# Patient Record
Sex: Female | Born: 1950 | Race: Black or African American | Hispanic: No | Marital: Single | Smoking: Never smoker
Health system: Southern US, Community
[De-identification: ages and names within clinical notes are randomized; demographics above are authoritative.]

## PROBLEM LIST (undated history)

## (undated) DIAGNOSIS — R011 Cardiac murmur, unspecified: Secondary | ICD-10-CM

## (undated) DIAGNOSIS — I10 Essential (primary) hypertension: Secondary | ICD-10-CM

## (undated) DIAGNOSIS — M1990 Unspecified osteoarthritis, unspecified site: Secondary | ICD-10-CM

## (undated) DIAGNOSIS — E669 Obesity, unspecified: Secondary | ICD-10-CM

## (undated) DIAGNOSIS — F329 Major depressive disorder, single episode, unspecified: Secondary | ICD-10-CM

## (undated) DIAGNOSIS — F419 Anxiety disorder, unspecified: Secondary | ICD-10-CM

## (undated) DIAGNOSIS — F32A Depression, unspecified: Secondary | ICD-10-CM

## (undated) DIAGNOSIS — R9439 Abnormal result of other cardiovascular function study: Secondary | ICD-10-CM

## (undated) HISTORY — DX: Cardiac murmur, unspecified: R01.1

## (undated) HISTORY — DX: Unspecified osteoarthritis, unspecified site: M19.90

## (undated) HISTORY — DX: Anxiety disorder, unspecified: F41.9

## (undated) HISTORY — DX: Major depressive disorder, single episode, unspecified: F32.9

## (undated) HISTORY — PX: ABDOMINAL HYSTERECTOMY: SHX81

## (undated) HISTORY — DX: Depression, unspecified: F32.A

## (undated) HISTORY — DX: Obesity, unspecified: E66.9

---

## 2001-02-17 ENCOUNTER — Emergency Department (HOSPITAL_COMMUNITY): Admission: EM | Admit: 2001-02-17 | Discharge: 2001-02-17 | Payer: Self-pay | Admitting: Emergency Medicine

## 2002-05-20 ENCOUNTER — Encounter: Payer: Self-pay | Admitting: Internal Medicine

## 2002-05-20 ENCOUNTER — Encounter: Admission: RE | Admit: 2002-05-20 | Discharge: 2002-05-20 | Payer: Self-pay | Admitting: Internal Medicine

## 2003-01-06 ENCOUNTER — Encounter: Admission: RE | Admit: 2003-01-06 | Discharge: 2003-01-06 | Payer: Self-pay | Admitting: Internal Medicine

## 2003-04-27 ENCOUNTER — Emergency Department (HOSPITAL_COMMUNITY): Admission: EM | Admit: 2003-04-27 | Discharge: 2003-04-27 | Payer: Self-pay | Admitting: Emergency Medicine

## 2005-10-31 ENCOUNTER — Encounter: Admission: RE | Admit: 2005-10-31 | Discharge: 2005-10-31 | Payer: Self-pay | Admitting: Internal Medicine

## 2005-12-02 ENCOUNTER — Other Ambulatory Visit: Admission: RE | Admit: 2005-12-02 | Discharge: 2005-12-02 | Payer: Self-pay | Admitting: Interventional Radiology

## 2005-12-02 ENCOUNTER — Encounter: Admission: RE | Admit: 2005-12-02 | Discharge: 2005-12-02 | Payer: Self-pay | Admitting: Internal Medicine

## 2005-12-02 ENCOUNTER — Encounter (INDEPENDENT_AMBULATORY_CARE_PROVIDER_SITE_OTHER): Payer: Self-pay | Admitting: *Deleted

## 2006-02-18 DIAGNOSIS — M129 Arthropathy, unspecified: Secondary | ICD-10-CM | POA: Insufficient documentation

## 2006-03-12 ENCOUNTER — Encounter: Admission: RE | Admit: 2006-03-12 | Discharge: 2006-03-12 | Payer: Self-pay | Admitting: Surgery

## 2006-03-13 ENCOUNTER — Encounter (INDEPENDENT_AMBULATORY_CARE_PROVIDER_SITE_OTHER): Payer: Self-pay | Admitting: Specialist

## 2006-03-13 ENCOUNTER — Ambulatory Visit (HOSPITAL_BASED_OUTPATIENT_CLINIC_OR_DEPARTMENT_OTHER): Admission: RE | Admit: 2006-03-13 | Discharge: 2006-03-13 | Payer: Self-pay | Admitting: Surgery

## 2006-04-17 ENCOUNTER — Encounter: Admission: RE | Admit: 2006-04-17 | Discharge: 2006-04-17 | Payer: Self-pay | Admitting: Internal Medicine

## 2007-09-23 ENCOUNTER — Ambulatory Visit: Payer: Self-pay | Admitting: Nurse Practitioner

## 2007-09-23 DIAGNOSIS — E669 Obesity, unspecified: Secondary | ICD-10-CM | POA: Insufficient documentation

## 2007-09-23 DIAGNOSIS — K219 Gastro-esophageal reflux disease without esophagitis: Secondary | ICD-10-CM | POA: Insufficient documentation

## 2007-09-28 ENCOUNTER — Encounter (INDEPENDENT_AMBULATORY_CARE_PROVIDER_SITE_OTHER): Payer: Self-pay | Admitting: Nurse Practitioner

## 2007-10-02 ENCOUNTER — Encounter (INDEPENDENT_AMBULATORY_CARE_PROVIDER_SITE_OTHER): Payer: Self-pay | Admitting: Nurse Practitioner

## 2007-11-18 ENCOUNTER — Emergency Department (HOSPITAL_COMMUNITY): Admission: EM | Admit: 2007-11-18 | Discharge: 2007-11-18 | Payer: Self-pay | Admitting: Family Medicine

## 2007-12-15 ENCOUNTER — Encounter (INDEPENDENT_AMBULATORY_CARE_PROVIDER_SITE_OTHER): Payer: Self-pay | Admitting: *Deleted

## 2008-04-19 ENCOUNTER — Encounter (INDEPENDENT_AMBULATORY_CARE_PROVIDER_SITE_OTHER): Payer: Self-pay | Admitting: Nurse Practitioner

## 2008-04-19 ENCOUNTER — Ambulatory Visit: Payer: Self-pay | Admitting: Nurse Practitioner

## 2008-04-19 DIAGNOSIS — I10 Essential (primary) hypertension: Secondary | ICD-10-CM | POA: Insufficient documentation

## 2008-04-19 DIAGNOSIS — I8390 Asymptomatic varicose veins of unspecified lower extremity: Secondary | ICD-10-CM | POA: Insufficient documentation

## 2008-04-19 LAB — CONVERTED CEMR LAB
ALT: 21 units/L (ref 0–35)
AST: 22 units/L (ref 0–37)
Albumin: 4.3 g/dL (ref 3.5–5.2)
Alkaline Phosphatase: 51 units/L (ref 39–117)
BUN: 11 mg/dL (ref 6–23)
Band Neutrophils: 0 % (ref 0–10)
Basophils Absolute: 0 10*3/uL (ref 0.0–0.1)
Basophils Relative: 0 % (ref 0–1)
CO2: 25 meq/L (ref 19–32)
Calcium: 9.2 mg/dL (ref 8.4–10.5)
Chlamydia, DNA Probe: NEGATIVE
Chloride: 107 meq/L (ref 96–112)
Cholesterol: 215 mg/dL — ABNORMAL HIGH (ref 0–200)
Creatinine, Ser: 0.85 mg/dL (ref 0.40–1.20)
Eosinophils Absolute: 0.1 10*3/uL (ref 0.0–0.7)
Eosinophils Relative: 1 % (ref 0–5)
GC Probe Amp, Genital: NEGATIVE
Glucose, Bld: 98 mg/dL (ref 70–99)
Glucose, Urine, Semiquant: NEGATIVE
HCT: 40.8 % (ref 36.0–46.0)
HDL: 63 mg/dL (ref >39)
Hemoglobin: 13 g/dL (ref 12.0–15.0)
KOH Prep: NEGATIVE
Ketones, urine, test strip: NEGATIVE
LDL Cholesterol: 138 mg/dL — ABNORMAL HIGH (ref 0–99)
Lymphocytes Relative: 47 % — ABNORMAL HIGH (ref 12–46)
Lymphs Abs: 2.5 10*3/uL (ref 0.7–4.0)
MCHC: 31.9 g/dL (ref 30.0–36.0)
MCV: 90.7 fL (ref 78.0–100.0)
Monocytes Absolute: 0.5 10*3/uL (ref 0.1–1.0)
Monocytes Relative: 10 % (ref 3–12)
Neutro Abs: 2.2 10*3/uL (ref 1.7–7.7)
Neutrophils Relative %: 42 % — ABNORMAL LOW (ref 43–77)
Nitrite: NEGATIVE
OCCULT 1: NEGATIVE
Platelets: 266 10*3/uL (ref 150–400)
Potassium: 4.3 meq/L (ref 3.5–5.3)
RBC: 4.5 M/uL (ref 3.87–5.11)
RDW: 15 % (ref 11.5–15.5)
Sodium: 145 meq/L (ref 135–145)
Specific Gravity, Urine: >=1.03
TSH: 1.64 microintl units/mL (ref 0.350–4.50)
Total Bilirubin: 0.4 mg/dL (ref 0.3–1.2)
Total CHOL/HDL Ratio: 3.4
Total Protein: 7.4 g/dL (ref 6.0–8.3)
Triglycerides: 68 mg/dL (ref <150)
Urobilinogen, UA: 0.2
VLDL: 14 mg/dL (ref 0–40)
WBC Urine, dipstick: NEGATIVE
WBC: 5.3 10*3/uL (ref 4.0–10.5)
pH: 6

## 2008-04-20 ENCOUNTER — Encounter (INDEPENDENT_AMBULATORY_CARE_PROVIDER_SITE_OTHER): Payer: Self-pay | Admitting: Nurse Practitioner

## 2008-04-21 ENCOUNTER — Ambulatory Visit (HOSPITAL_COMMUNITY): Admission: RE | Admit: 2008-04-21 | Discharge: 2008-04-21 | Payer: Self-pay | Admitting: Family Medicine

## 2008-04-22 ENCOUNTER — Encounter (INDEPENDENT_AMBULATORY_CARE_PROVIDER_SITE_OTHER): Payer: Self-pay | Admitting: Family Medicine

## 2008-04-27 ENCOUNTER — Encounter: Admission: RE | Admit: 2008-04-27 | Discharge: 2008-04-27 | Payer: Self-pay | Admitting: Family Medicine

## 2008-09-29 ENCOUNTER — Ambulatory Visit: Payer: Self-pay | Admitting: Nurse Practitioner

## 2008-09-29 DIAGNOSIS — R0602 Shortness of breath: Secondary | ICD-10-CM | POA: Insufficient documentation

## 2008-09-29 DIAGNOSIS — R609 Edema, unspecified: Secondary | ICD-10-CM | POA: Insufficient documentation

## 2008-09-29 LAB — CONVERTED CEMR LAB
Bilirubin Urine: NEGATIVE
Glucose, Urine, Semiquant: NEGATIVE
Ketones, urine, test strip: NEGATIVE
Microalb, Ur: 0.87 mg/dL (ref 0.00–1.89)
Nitrite: NEGATIVE
Protein, U semiquant: NEGATIVE
Specific Gravity, Urine: 1.025
Urobilinogen, UA: 0.2
pH: 5.5

## 2008-09-30 ENCOUNTER — Encounter (INDEPENDENT_AMBULATORY_CARE_PROVIDER_SITE_OTHER): Payer: Self-pay | Admitting: Nurse Practitioner

## 2008-09-30 LAB — CONVERTED CEMR LAB: Pro B Natriuretic peptide (BNP): 3.6 pg/mL (ref 0.0–100.0)

## 2008-10-03 ENCOUNTER — Telehealth (INDEPENDENT_AMBULATORY_CARE_PROVIDER_SITE_OTHER): Payer: Self-pay | Admitting: Nurse Practitioner

## 2008-10-19 ENCOUNTER — Ambulatory Visit: Payer: Self-pay | Admitting: Nurse Practitioner

## 2008-10-19 DIAGNOSIS — M545 Low back pain, unspecified: Secondary | ICD-10-CM | POA: Insufficient documentation

## 2008-10-19 LAB — CONVERTED CEMR LAB
Bilirubin Urine: NEGATIVE
Blood in Urine, dipstick: NEGATIVE
Glucose, Urine, Semiquant: NEGATIVE
Ketones, urine, test strip: NEGATIVE
Nitrite: NEGATIVE
Protein, U semiquant: 30
Specific Gravity, Urine: 1.015
Urobilinogen, UA: 0.2
pH: 7.5

## 2008-11-10 ENCOUNTER — Encounter (INDEPENDENT_AMBULATORY_CARE_PROVIDER_SITE_OTHER): Payer: Self-pay | Admitting: Nurse Practitioner

## 2008-12-06 ENCOUNTER — Emergency Department (HOSPITAL_COMMUNITY): Admission: EM | Admit: 2008-12-06 | Discharge: 2008-12-06 | Payer: Self-pay | Admitting: Family Medicine

## 2008-12-07 ENCOUNTER — Ambulatory Visit: Payer: Self-pay | Admitting: Nurse Practitioner

## 2008-12-09 ENCOUNTER — Ambulatory Visit (HOSPITAL_COMMUNITY): Admission: RE | Admit: 2008-12-09 | Discharge: 2008-12-09 | Payer: Self-pay | Admitting: Internal Medicine

## 2009-10-08 ENCOUNTER — Emergency Department (HOSPITAL_COMMUNITY): Admission: EM | Admit: 2009-10-08 | Discharge: 2009-10-08 | Payer: Self-pay | Admitting: Emergency Medicine

## 2009-10-19 ENCOUNTER — Ambulatory Visit: Payer: Self-pay | Admitting: Nurse Practitioner

## 2009-10-19 LAB — CONVERTED CEMR LAB
Bilirubin Urine: NEGATIVE
Creatinine, Urine: 213.3 mg/dL
Glucose, Urine, Semiquant: NEGATIVE
Ketones, urine, test strip: NEGATIVE
Microalb Creat Ratio: 2.3 mg/g (ref 0.0–30.0)
Microalb, Ur: 0.5 mg/dL (ref 0.00–1.89)
Nitrite: NEGATIVE
Protein, U semiquant: NEGATIVE
Specific Gravity, Urine: >=1.03
Urobilinogen, UA: 0.2
pH: 6

## 2010-03-09 ENCOUNTER — Encounter (INDEPENDENT_AMBULATORY_CARE_PROVIDER_SITE_OTHER): Payer: Self-pay | Admitting: Nurse Practitioner

## 2010-03-09 ENCOUNTER — Ambulatory Visit
Admission: RE | Admit: 2010-03-09 | Discharge: 2010-03-09 | Payer: Self-pay | Source: Home / Self Care | Attending: Nurse Practitioner | Admitting: Nurse Practitioner

## 2010-03-10 ENCOUNTER — Encounter: Payer: Self-pay | Admitting: Internal Medicine

## 2010-03-20 NOTE — Assessment & Plan Note (Signed)
Summary: HTN   Vital Signs:  Patient profile:   60 year old female Height:      65.25 inches Weight:      258 pounds BMI:     42.76 Temp:     98.3 degrees F oral Pulse rate:   69 / minute Pulse rhythm:   regular Resp:     16 per minute BP sitting:   158 / 88  (left arm)  Vitals Entered By: CMA Student Linzie Collin  Nutrition Counseling: Patient's BMI is greater than 25 and therefore counseled on weight management options. CC: follow-up visit BP, patient states blood pressure has ben up and dwon, patient has been experiencing headaches, Hypertension Management Is Patient Diabetic? No Pain Assessment Patient in pain? no       Does patient need assistance? Functional Status Self care Ambulation Normal   CC:  follow-up visit BP, patient states blood pressure has ben up and dwon, patient has been experiencing headaches, and Hypertension Management.  History of Present Illness:  Pt into the office for follow up on Er visit.   10/08/2009 for hypertension (full ER visit reviewed) Pt admits that she completed her supply of BP medications prior to ER visit. She notes that prior to onset she ate a pot pie which she found out afterward was high in sodium.  Social - Pt is not employed however he is school at this time.  Hypertension History:      She complains of headache, but denies chest pain and palpitations.  She notes no problems with any antihypertensive medication side effects.  Pt has completed her supply of blood pressure medication.  She was given lisinopril 5mg  by mouth x 10 tablets from the ER which she has finished as well.        Positive major cardiovascular risk factors include female age 60 years old or older and hypertension.  Negative major cardiovascular risk factors include no history of diabetes or hyperlipidemia, negative family history for ischemic heart disease, and non-tobacco-user status.        Further assessment for target organ damage reveals no  history of ASHD, cardiac end-organ damage (CHF/LVH), stroke/TIA, peripheral vascular disease, renal insufficiency, or hypertensive retinopathy.     Habits & Providers  Alcohol-Tobacco-Diet     Alcohol drinks/day: 0     Tobacco Status: never  Exercise-Depression-Behavior     Does Patient Exercise: no     Type of exercise: walking,cardio     Depression Counseling: not indicated; screening negative for depression     Drug Use: no     Seat Belt Use: 0     Sun Exposure: occasionally  Comments: Pt has recently gotten a gym membership at [employer] because she had slacked off her activities.  Current Medications (verified): 1)  Nexium 40 Mg  Cpdr (Esomeprazole Magnesium) .Marland Kitchen.. 1 Tablet By Mouth Daily For Stomach 2)  Zestoretic 10-12.5 Mg Tabs (Lisinopril-Hydrochlorothiazide) .... One Tablet By Mouth Daily For Blood Pressure 3)  Naproxen 500 Mg Tabs (Naproxen) .... One Tablet By Mouth Every 12 Hours As Needed For Pain 4)  Ultram 50 Mg Tabs (Tramadol Hcl) .Marland Kitchen.. 1-2 Tabs Every 6 Hours As Needed For Pain 5)  Cyclobenzaprine Hcl 10 Mg Tabs (Cyclobenzaprine Hcl) .... 1/2 - 1 Tablet By Mouth Nightly For Muscle Spasms  Allergies (verified): No Known Drug Allergies  Social History: Does Patient Exercise:  no  Review of Systems General:  Complains of weakness; denies fever. CV:  Denies chest pain or discomfort.  Resp:  Denies cough. GI:  Denies abdominal pain, nausea, and vomiting. Neuro:  Complains of headaches. Psych:  Denies depression.  Physical Exam  General:  alert.   Head:  normocephalic.   Lungs:  normal breath sounds.   Heart:  normal rate and regular rhythm.   Abdomen:  normal bowel sounds.   Extremities:  1+ left pedal edema and 1+ right pedal edema.   Neurologic:  gait normal.     Impression & Recommendations:  Problem # 1:  HYPERTENSION, BENIGN ESSENTIAL (ICD-401.1) DASH diet reviewed with pt will restart blood pressure medications Her updated medication list for  this problem includes:    Zestoretic 10-12.5 Mg Tabs (Lisinopril-hydrochlorothiazide) ..... One tablet by mouth daily for blood pressure  Orders: UA Dipstick w/o Micro (manual) (91478) T-Urine Microalbumin w/creat. ratio [phone])  Problem # 2:  OBESITY (ICD-278.00) pt to restart her exercise regimen pt advised to continue to monitor diet and make better food choices will refer for a nutrition class  Complete Medication List: 1)  Nexium 40 Mg Cpdr (Esomeprazole magnesium) .Marland Kitchen.. 1 tablet by mouth daily for stomach 2)  Zestoretic 10-12.5 Mg Tabs (Lisinopril-hydrochlorothiazide) .... One tablet by mouth daily for blood pressure 3)  Naproxen 500 Mg Tabs (Naproxen) .... One tablet by mouth every 12 hours as needed for pain 4)  Ultram 50 Mg Tabs (Tramadol hcl) .Marland Kitchen.. 1-2 tabs every 6 hours as needed for pain 5)  Cyclobenzaprine Hcl 10 Mg Tabs (Cyclobenzaprine hcl) .... 1/2 - 1 tablet by mouth nightly for muscle spasms  Hypertension Assessment/Plan:      The patient's hypertensive risk group is category B: At least one risk factor (excluding diabetes) with no target organ damage.  Her calculated 10 year risk of coronary heart disease is 8 %.  Today's blood pressure is 158/88.  Her blood pressure goal is < 140/90.  Patient Instructions: 1)  Keep your appointment for a complete physical exam 2)  do not eat after midnight before this visit 3)  You will be contacted about the group nutrition meeting that will be held here every 2nd and 4th Wednesday starting in October. 4)  Blood pressure - high today.  You will need to restart your blood pressure medications.  Be sure to monitor the sodium in your diet. Prescriptions: ZESTORETIC 10-12.5 MG TABS (LISINOPRIL-HYDROCHLOROTHIAZIDE) One tablet by mouth daily for blood pressure  #30 x 5   Entered and Authorized by:   Lehman Prom FNP   Signed by:   Lehman Prom FNP on 60/02/2009   Method used:   Print then Give to Patient   RxID:    [id number]   Laboratory Results   Urine Tests    Routine Urinalysis   Color: yellow Glucose: negative   (Normal Range: Negative) Bilirubin: negative   (Normal Range: Negative) Ketone: negative   (Normal Range: Negative) Spec. Gravity: >=1.030   (Normal Range: 1.003-1.035) Blood: trace-intact   (Normal Range: Negative) pH: 6.0   (Normal Range: 5.0-8.0) Protein: negative   (Normal Range: Negative) Urobilinogen: 0.2   (Normal Range: 0-1) Nitrite: negative   (Normal Range: Negative) Leukocyte Esterace: large   (Normal Range: Negative)        CT Scan  Procedure date:  10/08/2009  Findings:      Normal head CT   CT Scan  Procedure date:  10/08/2009  Findings:      Normal head CT

## 2010-03-22 NOTE — Letter (Signed)
Summary: *Referral Letter  [facility]  [address] [place], [place] [address]   Phone: [phone]  Fax: [phone]    03/09/2010  [name] [address] [place], [place]  [address]  Phone: [phone]  To Whom it May Concern: Valerie Cowan is an established patient in this office.  She has a history of arthritis in her knees. Recent exercises that require bending at the knees such as squats and  lunges have exacerbated her knee symptoms. It is my recommendation that she not participate in these exercises.   Water aerobics would be more beneficial to the patient as it would reduce the inpact on her joints.   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  Sincerely,   Lehman Prom FNP [facility]

## 2010-03-22 NOTE — Assessment & Plan Note (Signed)
Summary: Knee pain   Vital Signs:  Patient profile:   60 year old female Weight:      252.2 pounds BMI:     41.80 Temp:     97.8 degrees F oral Pulse rate:   76 / minute Pulse rhythm:   regular Resp:     20 per minute BP sitting:   140 / 100  (left arm) Cuff size:   large  Vitals Entered By: Levon Hedger (March 09, 2010 2:55 PM)  Nutrition Counseling: Patient's BMI is greater than 25 and therefore counseled on weight management options. CC: knee pain x 2 weeks, Hypertension Management Is Patient Diabetic? No Pain Assessment Patient in pain? yes     Location: knee Intensity: 10  Does patient need assistance? Functional Status Self care Ambulation Normal   CC:  knee pain x 2 weeks and Hypertension Management.  History of Present Illness:  Pt into the office with c/o knee pain Pt has started a new exercise class in the last 2 weeks Some achiness in knees prior to that start of the class but pain has intensified Exercise requires some lunges, aerobics L>R Stiffness when sitting for long periods of time Unable to bend down and put sock and shoe on due to the pain   Obesit - down 6 pounds since the last visit  Hypertension History:      She denies headache, chest pain, and palpitations.  She notes no problems with any antihypertensive medication side effects.  BP has refills on her blood pressure medication and she has been taking as ordered  Tearful during exam because she speaks about the recent passing of her sister and within the past hour she learned of her aunt that raised her passing.        Positive major cardiovascular risk factors include female age 99 years old or older and hypertension.  Negative major cardiovascular risk factors include no history of diabetes or hyperlipidemia, negative family history for ischemic heart disease, and non-tobacco-user status.        Further assessment for target organ damage reveals no history of ASHD, cardiac end-organ  damage (CHF/LVH), stroke/TIA, peripheral vascular disease, renal insufficiency, or hypertensive retinopathy.     Allergies (verified): No Known Drug Allergies  Review of Systems General:  Denies fever. CV:  Denies chest pain or discomfort. Resp:  Denies cough. GI:  Denies abdominal pain, nausea, and vomiting. MS:  Complains of joint pain.  Physical Exam  General:  alert.   Head:  normocephalic.   Eyes:  glasses Lungs:  normal breath sounds.   Heart:  normal rate and regular rhythm.   Abdomen:  normal bowel sounds.   Msk:  decreased ROM.   Neurologic:  alert & oriented X3.   Skin:  color normal.   Psych:  Oriented X3.     Knee Exam  General:    obese.    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)  advised of the dx pt has arthritis and recent aerobics class has worsened symptoms advised her not to participate in knee bending activities  Orders: Depo- Medrol 80mg  (J1040) Ketorolac-Toradol 15mg  (J1914) Admin of Therapeutic Inj  intramuscular or subcutaneous (  04540)  Problem # 2:  HYPERTENSION, BENIGN ESSENTIAL (ICD-401.1) Assessment: Deteriorated BP is slightly elevated  DASH diet Pt to continue current meds - pain today in office may contribute to elevated blood pressure Her updated medication list for this problem includes:    Zestoretic 10-12.5 Mg Tabs (Lisinopril-hydrochlorothiazide) ..... One tablet by mouth daily for blood pressure  Complete Medication List: 1)  Zestoretic 10-12.5 Mg Tabs (Lisinopril-hydrochlorothiazide) .... One tablet by mouth daily for blood pressure 2)  Naproxen 500 Mg Tabs (Naproxen) .... One tablet by mouth every 12 hours as needed for pain  Hypertension Assessment/Plan:      The patient's hypertensive risk group is  category B: At least one risk factor (excluding diabetes) with no target organ damage.  Her calculated 10 year risk of coronary heart disease is 9 %.  Today's blood pressure is 140/100.  Her blood pressure goal is < 140/90.  Patient Instructions: 1)  Blood pressure - elevated today.  May be due to pain or recent loss of family 2)  knee pain - You have arthritis in your knees.  This pain has likely worsened since you started the aerobics class.  3)  You will be given an injection of medication today. 4)  STOP taking aleve and BC as they will irritate your stomach. 5)  Will order Naprosyn as needed for pain. 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)    Route: IM    Site: RUOQ gluteus    Exp Date: 08/2010    Lot #: [id number]    Mfr: Pharmacia    Patient tolerated injection without complications    Given by: Levon Hedger (March 09, 2010 5:36 PM)  Injection # 2:    Medication: Ketorolac-Toradol 15mg     Diagnosis: ARTHRITIS, KNEES, 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 Patient Level III [52841]    Prevention & Chronic Care Immunizations   Influenza vaccine: Not documented   Influenza  vaccine deferral: Refused  (03/09/2010)    Tetanus booster: 04/19/2008: Tdap    Pneumococcal vaccine: Not documented  Colorectal Screening   Hemoccult: Not documented    Colonoscopy: negative per pt  (09/19/2006)  Other Screening   Pap smear:  Specimen Adequacy: Satisfactory for evaluation.   Interpretation/Result:Negative for intraepithelial Lesion or Malignancy.     (04/19/2008)   Pap smear action/deferral: PAP smear done  (04/19/2008)    Mammogram: BI-RADS CATEGORY 1:  Negative.^MM DIGITAL DIAG LTD R  (04/27/2008)   Mammogram action/deferral: mammogram ordered  (04/19/2008)   Smoking status: never  (10/19/2009)  Lipids  Total Cholesterol: 215  (04/19/2008)   LDL: 138  (04/19/2008)   LDL Direct: Not documented   HDL: 63  (04/19/2008)   Triglycerides: 68  (04/19/2008)  Hypertension   Last Blood Pressure: 140 / 100  (03/09/2010)   Serum creatinine: 0.85  (04/19/2008)   Serum potassium 4.3  (04/19/2008)  Self-Management Support :    Hypertension self-management support: Not documented   Medication Administration  Injection # 1:    Medication: Depo- Medrol 80mg     Diagnosis: ARTHRITIS, KNEES, 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)    Route: IM    Site: RUOQ gluteus    Exp Date: 08/2010    Lot #: [id number]    Mfr: Pharmacia    Patient tolerated injection without complications    Given by: Levon Hedger (March 09, 2010 5:36 PM)  Injection # 2:    Medication: Ketorolac-Toradol 15mg     Diagnosis: ARTHRITIS, KNEES, 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 Patient Level III [81191]

## 2010-04-30 ENCOUNTER — Encounter: Payer: Self-pay | Admitting: Nurse Practitioner

## 2010-04-30 ENCOUNTER — Other Ambulatory Visit (HOSPITAL_COMMUNITY): Payer: Self-pay | Admitting: Internal Medicine

## 2010-04-30 ENCOUNTER — Encounter (INDEPENDENT_AMBULATORY_CARE_PROVIDER_SITE_OTHER): Payer: Self-pay | Admitting: Nurse Practitioner

## 2010-04-30 ENCOUNTER — Other Ambulatory Visit: Payer: Self-pay | Admitting: Nurse Practitioner

## 2010-04-30 DIAGNOSIS — Z1231 Encounter for screening mammogram for malignant neoplasm of breast: Secondary | ICD-10-CM

## 2010-04-30 LAB — CONVERTED CEMR LAB
Bilirubin Urine: NEGATIVE
Blood in Urine, dipstick: NEGATIVE
Glucose, Urine, Semiquant: NEGATIVE
KOH Prep: NEGATIVE
Ketones, urine, test strip: NEGATIVE
Nitrite: NEGATIVE
Protein, U semiquant: NEGATIVE
Specific Gravity, Urine: 1.015
Urobilinogen, UA: 0.2
pH: 7

## 2010-04-30 LAB — CYTOLOGY - PAP

## 2010-05-01 ENCOUNTER — Encounter (INDEPENDENT_AMBULATORY_CARE_PROVIDER_SITE_OTHER): Payer: Self-pay | Admitting: Nurse Practitioner

## 2010-05-01 LAB — CONVERTED CEMR LAB
ALT: 13 units/L (ref 0–35)
AST: 19 units/L (ref 0–37)
Albumin: 4 g/dL (ref 3.5–5.2)
Alkaline Phosphatase: 49 units/L (ref 39–117)
BUN: 14 mg/dL (ref 6–23)
Basophils Absolute: 0 10*3/uL (ref 0.0–0.1)
Basophils Relative: 0 % (ref 0–1)
CO2: 30 meq/L (ref 19–32)
Calcium: 9.4 mg/dL (ref 8.4–10.5)
Chlamydia, DNA Probe: NEGATIVE
Chloride: 104 meq/L (ref 96–112)
Creatinine, Ser: 1.01 mg/dL (ref 0.40–1.20)
Eosinophils Absolute: 0.1 10*3/uL (ref 0.0–0.7)
Eosinophils Relative: 1 % (ref 0–5)
GC Probe Amp, Genital: NEGATIVE
Glucose, Bld: 90 mg/dL (ref 70–99)
HCT: 38.3 % (ref 36.0–46.0)
Hemoglobin: 12.2 g/dL (ref 12.0–15.0)
Lymphocytes Relative: 48 % — ABNORMAL HIGH (ref 12–46)
Lymphs Abs: 3.7 10*3/uL (ref 0.7–4.0)
MCHC: 31.9 g/dL (ref 30.0–36.0)
MCV: 89.3 fL (ref 78.0–100.0)
Microalb, Ur: 0.5 mg/dL (ref 0.00–1.89)
Monocytes Absolute: 0.6 10*3/uL (ref 0.1–1.0)
Monocytes Relative: 8 % (ref 3–12)
Neutro Abs: 3.2 10*3/uL (ref 1.7–7.7)
Neutrophils Relative %: 42 % — ABNORMAL LOW (ref 43–77)
Platelets: 271 10*3/uL (ref 150–400)
Potassium: 4.7 meq/L (ref 3.5–5.3)
RBC: 4.29 M/uL (ref 3.87–5.11)
RDW: 15.1 % (ref 11.5–15.5)
Sodium: 141 meq/L (ref 135–145)
TSH: 1.252 microintl units/mL (ref 0.350–4.500)
Total Bilirubin: 0.3 mg/dL (ref 0.3–1.2)
Total Protein: 7.1 g/dL (ref 6.0–8.3)
WBC: 7.6 10*3/uL (ref 4.0–10.5)

## 2010-05-02 ENCOUNTER — Encounter (INDEPENDENT_AMBULATORY_CARE_PROVIDER_SITE_OTHER): Payer: Self-pay | Admitting: Nurse Practitioner

## 2010-05-07 ENCOUNTER — Other Ambulatory Visit: Payer: Self-pay | Admitting: Internal Medicine

## 2010-05-07 ENCOUNTER — Ambulatory Visit (HOSPITAL_COMMUNITY)
Admission: RE | Admit: 2010-05-07 | Discharge: 2010-05-07 | Disposition: A | Discharge status: Home / Self Care | Payer: Self-pay | Source: Ambulatory Visit | Attending: Internal Medicine | Admitting: Internal Medicine

## 2010-05-07 DIAGNOSIS — M25569 Pain in unspecified knee: Secondary | ICD-10-CM

## 2010-05-07 DIAGNOSIS — Z1231 Encounter for screening mammogram for malignant neoplasm of breast: Secondary | ICD-10-CM | POA: Insufficient documentation

## 2010-05-08 NOTE — Letter (Signed)
Summary: TEST ORDER FORM//MAMMOGRAM//APPT DATE & TIME  TEST ORDER FORM//MAMMOGRAM//APPT DATE & TIME   Imported By: Arta Bruce 05/04/2010 11:50:17  _____________________________________________________________________  External Attachment:    Type:   Image     Comment:   External Document

## 2010-05-08 NOTE — Letter (Signed)
Summary: *HSN Results Follow up  [facility]  [address] [place], [place] [address]   Phone: [phone]  Fax: [phone]      05/01/2010   Valerie Cowan [address] [place], [place]  [address]   Dear  Valerie Cowan,                            ____S.Drinkard,FNP   ____D. Gore,FNP       ____B. McPherson,MD   ____V. Rankins,MD    ____E. Mulberry,MD    __X__N. Daphine Deutscher, FNP  ____D. Reche Dixon, MD    ____K. Philipp Deputy, MD    ____Other     This letter is to inform you that your recent test(s):  ___X____Pap Smear    ___X____Lab Test     _______X-ray    ___X____ is within acceptable limits  _______ requires a medication change  _______ requires a follow-up lab visit  _______ requires a follow-up visit with your provider   Comments: Labs done during recent office visist are normal.  Pap Smear results ________________________.       _________________________________________________________ If you have any questions, please contact our office [phone].                    Sincerely,    Lehman Prom FNP [facility]

## 2010-05-08 NOTE — Assessment & Plan Note (Signed)
Summary: Complete Physical Exam   Vital Signs:  Patient profile:   60 year old female Menstrual status:  postmenopausal Weight:      259.8 pounds BMI:     43.06 Temp:     98.1 degrees F oral Pulse rate:   71 / minute Pulse rhythm:   regular Resp:     16 per minute BP sitting:   133 / 80  (left arm) Cuff size:   large  Vitals Entered By: Levon Hedger (April 30, 2010 3:14 PM)  Nutrition Counseling: Patient's BMI is greater than 25 and therefore counseled on weight management options. CC: CPP...left leg pain, Hypertension Management, Abdominal Pain Is Patient Diabetic? No Pain Assessment Patient in pain? yes     Location: leg Intensity: 8-9  Does patient need assistance? Functional Status Self care Ambulation Normal  Vision Screening:Left eye with correction: 20 / 20-1 Right eye with correction: 20 / 20-2 Both eyes with correction: 20 / 20-1        Vision Entered By: Levon Hedger (April 30, 2010 3:42 PM) LMP - Character: partial hysterctomy     Menstrual Status postmenopausal Last PAP Result  Specimen Adequacy: Satisfactory for evaluation.   Interpretation/Result:Negative for intraepithelial Lesion or Malignancy.      CC:  CPP...left leg pain, Hypertension Management, and Abdominal Pain.  History of Present Illness:  Pt into the office for a complete physical exam  PAP - last done in 2010. S/p partial hysterectomy no family history of cervical or ovarian cancer  Mammgram - last mammogram on 2010 no family history of breast cancer  Optho - wears glasses.  last eye exam 2 years ago  Dental - last dental exam  Left knee - ongoing problems. Notices some swelling at times.  Sitting for long periods of time bothers the pt  Dyspepsia History:      She has no alarm features of dyspepsia including no history of melena, hematochezia, dysphagia, persistent vomiting, or involuntary weight loss > 5%.  There is a prior history of GERD.  The patient does not  have a prior history of documented ulcer disease.  The dominant symptom is not heartburn or acid reflux.  An H-2 blocker medication is not currently being taken.    Hypertension History:      She denies headache, chest pain, and palpitations.  She notes no problems with any antihypertensive medication side effects.        Positive major cardiovascular risk factors include female age 81 years old or older and hypertension.  Negative major cardiovascular risk factors include no history of diabetes or hyperlipidemia, negative family history for ischemic heart disease, and non-tobacco-user status.        Further assessment for target organ damage reveals no history of ASHD, cardiac end-organ damage (CHF/LVH), stroke/TIA, peripheral vascular disease, renal insufficiency, or hypertensive retinopathy.      Allergies (verified): No Known Drug Allergies  Review of Systems General:  Denies fever. Eyes:  Denies discharge. ENT:  Denies earache. CV:  Denies fatigue. Resp:  Denies cough. GI:  Denies abdominal pain, nausea, and vomiting. GU:  Denies discharge. MS:  Complains of joint pain; bil knee pain L>R. Derm:  Denies dryness. Neuro:  Denies headaches. Psych:  Denies depression. Endo:  Denies excessive urination.  Physical Exam  General:  alert.   Head:  normocephalic.   Eyes:  glasses Ears:  bil TM with bony landmarks present Nose:  no nasal discharge.   Mouth:  fair dentition.  Neck:  supple.   Chest Wall:  no mass.   Breasts:  no masses.   Lungs:  normal breath sounds.   Heart:  normal rate and regular rhythm.   Abdomen:  normal bowel sounds.   Rectal:  no external abnormalities.   Neurologic:  alert & oriented X3.   Skin:  color normal.   Psych:  Oriented X3.    Pelvic Exam  Vulva:      normal appearance.   Urethra and Bladder:      Urethra--normal.   Vagina:      physiologic discharge.   Cervix:      absent Uterus:      smooth.   Adnexa:      nontender  bilaterally.   Rectum:      normal, heme negative stool.     Knee Exam  General:    obese.    Knee Exam:    Left:    Inspection:  Abnormal    Palpation:  Abnormal       Location:  patellar    Stability:  stable    Tenderness:  no    Swelling:  no    Erythema:  no    crepitus with flexion    Impression & Recommendations:  Problem # 1:  ROUTINE GYNECOLOGICAL EXAMINATION (ICD-V72.31) rec optho and dental guaiac negative Orders: Vision Screening (16109) Hemoccult Guaiac-1 spec.(in office) (82270) Pap Smear, Thin Prep ( Collection of) (U0454) UA Dipstick w/o Micro (manual) (81002) Rapid HIV  (09811) Radiology other (Radiology Other) T- GC Chlamydia (91478) T-Urine Microalbumin w/creat. 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 One tablet by mouth daily for blood pressure  Orders: T-Comprehensive Metabolic Panel (32440-10272)  Problem # 4:  GERD (ICD-530.81)  Orders: T-CBC w/Diff (53664-40347)  Problem # 5:  OBESITY (ICD-278.00) advised pt to increase activity Orders: T-TSH (42595-63875)  Complete Medication List: 1)  Zestoretic 10-12.5 Mg Tabs (Lisinopril-hydrochlorothiazide) .... One tablet by mouth daily for blood pressure 2)  Naproxen 500 Mg Tabs (Naproxen) .... One tablet by mouth every 12 hours as needed for pain 3)  Tramadol Hcl 50 Mg Tabs (Tramadol hcl) .... One tablet by mouth daily twice daily  as needed for pain  Other Orders: Mammogram (Screening) (Mammo)  Hypertension Assessment/Plan:      The patient's hypertensive risk group is category B: At least one risk factor (excluding diabetes) with no target organ damage.  Her calculated 10 year risk of  coronary heart disease is 6 %.  Today's blood pressure is 133/80.  Her blood pressure goal is < 140/90.  Patient Instructions: 1)  Schedule lab appointment for lipids - no food after midnight before this visit.   2)  Keep your appointment for mammogram 3)  No appointment needed for x-ray of knees.  Just be sure to take order form with you 4)  You will be notified of the results.  Will refill naprosyn and will also given tramadol for extreme pain 5)  Dental - you can go to [facility] [phone] or [facility] for routine cleanings 6)  Follow up in with provider in 6 months or sooner if necessary Prescriptions: NAPROXEN 500 MG TABS (NAPROXEN) One tablet by mouth every 12 hours as needed  for pain  #50 x 1   Entered and Authorized by:   Lehman Prom FNP   Signed by:   Lehman Prom FNP on 04/30/2010   Method used:   Print then Give to Patient   RxID:   [id number] ZESTORETIC 10-12.5 MG TABS (LISINOPRIL-HYDROCHLOROTHIAZIDE) One tablet by mouth daily for blood pressure  #30 x 11   Entered and Authorized by:   Lehman Prom FNP   Signed by:   Lehman Prom FNP on 04/30/2010   Method used:   Print then Give to Patient   RxID:   [id number] TRAMADOL HCL 50 MG TABS (TRAMADOL HCL) One tablet by mouth daily twice daily  as needed for pain  #50 x 0   Entered and Authorized by:   Lehman Prom FNP   Signed by:   Lehman Prom FNP on 04/30/2010   Method used:   Print then Give to Patient   RxID:   [id number]    Orders Added: 1)  Est. Patient age 35-64 [99396] 2)  Vision Screening [99173] 3)  Mammogram (Screening) [Mammo] 4)  Hemoccult Guaiac-1 spec.(in office) [82270] 5)  Pap Smear, Thin Prep ( Collection of) [Q0091] 6)  UA Dipstick w/o Micro (manual) [81002] 7)  Rapid HIV  [92370] 8)  Radiology other [Radiology Other] 9)  T- GC Chlamydia [25366] 10)  T-Urine Microalbumin w/creat. ratio [82043-[address]] 11)  T-TSH [44034-74259] 12)  T-Syphilis Test  (RPR) [56387-56433] 13)  T-CBC w/Diff [29518-84166] 14)  T-Comprehensive Metabolic Panel [80053-22900]    Laboratory Results   Urine Tests  Date/Time Received: April 30, 2010 3:39 PM   Routine Urinalysis   Glucose: negative   (Normal Range: Negative) Bilirubin: negative   (Normal Range: Negative) Ketone: negative   (Normal Range: Negative) Spec. Gravity: 1.015   (Normal Range: 1.003-1.035) Blood: negative   (Normal Range: Negative) pH: 7.0   (Normal Range: 5.0-8.0) Protein: negative   (Normal Range: Negative) Urobilinogen: 0.2   (Normal Range: 0-1) Nitrite: negative   (Normal Range: Negative) Leukocyte Esterace: small   (Normal Range: Negative)    Date/Time Received: May 01, 2010 10:24 AM   [place]/KOH Source: vaginal WBC/hpf: 1-5 Bacteria/hpf: rare Clue cells/hpf: none Yeast/hpf: none Trichomonas/hpf: none  Stool - Occult Blood Hemmoccult #1: negative Date: 05/01/2010    Laboratory Results   Urine Tests    Routine Urinalysis   Glucose: negative   (Normal Range: Negative) Bilirubin: negative   (Normal Range: Negative) Ketone: negative   (Normal Range: Negative) Spec. Gravity: 1.015   (Normal Range: 1.003-1.035) Blood: negative   (Normal Range: Negative) pH: 7.0   (Normal Range: 5.0-8.0) Protein: negative   (Normal Range: Negative) Urobilinogen: 0.2   (Normal Range: 0-1) Nitrite: negative   (Normal Range: Negative) Leukocyte Esterace: small   (Normal Range: Negative)      [place] KOH: Negative

## 2010-05-09 ENCOUNTER — Encounter (INDEPENDENT_AMBULATORY_CARE_PROVIDER_SITE_OTHER): Payer: Self-pay | Admitting: Nurse Practitioner

## 2010-05-10 LAB — CONVERTED CEMR LAB
Cholesterol: 202 mg/dL — ABNORMAL HIGH (ref 0–200)
HDL: 74 mg/dL (ref >39)
LDL Cholesterol: 116 mg/dL — ABNORMAL HIGH (ref 0–99)
Total CHOL/HDL Ratio: 2.7
Triglycerides: 59 mg/dL (ref <150)
VLDL: 12 mg/dL (ref 0–40)

## 2010-05-18 ENCOUNTER — Inpatient Hospital Stay (INDEPENDENT_AMBULATORY_CARE_PROVIDER_SITE_OTHER)
Admission: RE | Admit: 2010-05-18 | Discharge: 2010-05-18 | Disposition: A | Discharge status: Home / Self Care | Payer: Self-pay | Source: Ambulatory Visit | Attending: Emergency Medicine | Admitting: Emergency Medicine

## 2010-05-18 DIAGNOSIS — J309 Allergic rhinitis, unspecified: Secondary | ICD-10-CM

## 2010-05-18 DIAGNOSIS — J069 Acute upper respiratory infection, unspecified: Secondary | ICD-10-CM

## 2010-07-06 NOTE — Op Note (Signed)
NAME:  Valerie Cowan, Valerie Cowan NO.:  [id number]   MEDICAL RECORD NO.:  [id number]          PATIENT TYPE:  AMB   LOCATION:  [facility]                          FACILITY:  [facility]   PHYSICIAN:  Velora Heckler, MD      DATE OF BIRTH:  02/01/1951   DATE OF PROCEDURE:  03/13/2006  DATE OF DISCHARGE:                               OPERATIVE REPORT   PREOPERATIVE DIAGNOSIS:  Anterior neck mass.   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   PROCEDURE:  Excision of anterior neck mass (2.5 cm)   SURGEON:  Velora Heckler, MD, FACS   ANESTHESIA:  General.   ESTIMATED BLOOD LOSS:  Minimal.   PREPARATION:  Betadine.   COMPLICATIONS:  None.   INDICATIONS:  The patient is a 60 year old black female referred by Dr.  Kern Reap with anterior neck mass.  This had been found on self  examination.  It had gradually increased in size.  Ultrasound  demonstrated a 3.3-cm solid lesion in the anterior neck.  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  The patient now  comes to surgery for excision.   BODY OF REPORT:  Procedure is done in OR #6 at the [facility].  The patient is brought to the operating room and placed in the  supine position on the operating room table.  Following administration  of general anesthesia, the patient is positioned and then prepped and  draped in the usual strict aseptic fashion.  After ascertaining that an  adequate level of anesthesia had been obtained, a transverse incision is  made over the mass in a skin fold.  Dissection is carried down through  subcutaneous tissues.  Platysma is divided.  Skin flaps are elevated  cephalad and caudad.  Mass is then excised along its capsule using the  electrocautery for hemostasis.  Venous tributaries are divided between  hemostats and ligated with 3-0 Vicryl or 3-0 Vicryl suture ligatures.  Good hemostasis is maintained.  The entire mass is  excised with one  adjacent lymph node.  It is submitted to pathology for review.  Good  hemostasis is noted in the operative field.  A small piece of Surgicel  is placed in the base of the wound.  Platysma is closed with interrupted  3-0 Vicryl sutures.  Skin was anesthetized with local Marcaine  anesthetic.  The skin is closed with running 4-0 Monocryl subcuticular  suture.  Wound was washed and dried and benzoin and Steri-Strips are  applied.  Sterile dressings are applied.  The patient is awakened from  anesthesia and brought to the recovery room in stable condition.  The  patient tolerated the procedure well.      Velora Heckler, MD  Electronically Signed     TMG/MEDQ  D:  03/13/2006  T:  03/13/2006  Job:  [id number]   cc:   Olene Craven, M.D.

## 2013-02-26 ENCOUNTER — Emergency Department (HOSPITAL_COMMUNITY): Payer: No Typology Code available for payment source

## 2013-02-26 ENCOUNTER — Encounter (HOSPITAL_COMMUNITY): Payer: Self-pay | Admitting: Emergency Medicine

## 2013-02-26 ENCOUNTER — Emergency Department (HOSPITAL_COMMUNITY)
Admission: EM | Admit: 2013-02-26 | Discharge: 2013-02-26 | Disposition: A | Discharge status: Home / Self Care | Payer: No Typology Code available for payment source | Attending: Emergency Medicine | Admitting: Emergency Medicine

## 2013-02-26 DIAGNOSIS — R1110 Vomiting, unspecified: Secondary | ICD-10-CM | POA: Insufficient documentation

## 2013-02-26 DIAGNOSIS — R059 Cough, unspecified: Secondary | ICD-10-CM | POA: Insufficient documentation

## 2013-02-26 DIAGNOSIS — IMO0002 Reserved for concepts with insufficient information to code with codable children: Secondary | ICD-10-CM | POA: Insufficient documentation

## 2013-02-26 DIAGNOSIS — N390 Urinary tract infection, site not specified: Secondary | ICD-10-CM

## 2013-02-26 DIAGNOSIS — I10 Essential (primary) hypertension: Secondary | ICD-10-CM | POA: Insufficient documentation

## 2013-02-26 DIAGNOSIS — Z79899 Other long term (current) drug therapy: Secondary | ICD-10-CM | POA: Insufficient documentation

## 2013-02-26 DIAGNOSIS — R05 Cough: Secondary | ICD-10-CM | POA: Insufficient documentation

## 2013-02-26 DIAGNOSIS — M179 Osteoarthritis of knee, unspecified: Secondary | ICD-10-CM

## 2013-02-26 DIAGNOSIS — M1710 Unilateral primary osteoarthritis, unspecified knee: Secondary | ICD-10-CM | POA: Insufficient documentation

## 2013-02-26 DIAGNOSIS — G8929 Other chronic pain: Secondary | ICD-10-CM | POA: Insufficient documentation

## 2013-02-26 HISTORY — DX: Essential (primary) hypertension: I10

## 2013-02-26 LAB — URINALYSIS, ROUTINE W REFLEX MICROSCOPIC
BILIRUBIN URINE: NEGATIVE
Glucose, UA: NEGATIVE mg/dL
HGB URINE DIPSTICK: NEGATIVE
Ketones, ur: NEGATIVE mg/dL
NITRITE: NEGATIVE
PH: 6.5 (ref 5.0–8.0)
Protein, ur: NEGATIVE mg/dL
SPECIFIC GRAVITY, URINE: 1.021 (ref 1.005–1.030)
Urobilinogen, UA: 1 mg/dL (ref 0.0–1.0)

## 2013-02-26 LAB — COMPREHENSIVE METABOLIC PANEL
ALT: 11 U/L (ref 0–35)
AST: 15 U/L (ref 0–37)
Albumin: 3.4 g/dL — ABNORMAL LOW (ref 3.5–5.2)
Alkaline Phosphatase: 46 U/L (ref 39–117)
BUN: 11 mg/dL (ref 6–23)
CALCIUM: 9 mg/dL (ref 8.4–10.5)
CO2: 31 mEq/L (ref 19–32)
Chloride: 105 mEq/L (ref 96–112)
Creatinine, Ser: 0.96 mg/dL (ref 0.50–1.10)
GFR calc Af Amer: 72 mL/min — ABNORMAL LOW (ref >90)
GFR calc non Af Amer: 62 mL/min — ABNORMAL LOW (ref >90)
Glucose, Bld: 85 mg/dL (ref 70–99)
Potassium: 4.2 mEq/L (ref 3.7–5.3)
SODIUM: 142 meq/L (ref 137–147)
TOTAL PROTEIN: 7.4 g/dL (ref 6.0–8.3)
Total Bilirubin: 0.5 mg/dL (ref 0.3–1.2)

## 2013-02-26 LAB — URINE MICROSCOPIC-ADD ON

## 2013-02-26 LAB — CBC WITH DIFFERENTIAL/PLATELET
BASOS ABS: 0 10*3/uL (ref 0.0–0.1)
BASOS PCT: 0 % (ref 0–1)
EOS ABS: 0.1 10*3/uL (ref 0.0–0.7)
EOS PCT: 1 % (ref 0–5)
HCT: 37.7 % (ref 36.0–46.0)
Hemoglobin: 12.6 g/dL (ref 12.0–15.0)
LYMPHS PCT: 50 % — AB (ref 12–46)
Lymphs Abs: 2.7 10*3/uL (ref 0.7–4.0)
MCH: 30.1 pg (ref 26.0–34.0)
MCHC: 33.4 g/dL (ref 30.0–36.0)
MCV: 90 fL (ref 78.0–100.0)
Monocytes Absolute: 0.4 10*3/uL (ref 0.1–1.0)
Monocytes Relative: 8 % (ref 3–12)
Neutro Abs: 2.2 10*3/uL (ref 1.7–7.7)
Neutrophils Relative %: 41 % — ABNORMAL LOW (ref 43–77)
PLATELETS: 235 10*3/uL (ref 150–400)
RBC: 4.19 MIL/uL (ref 3.87–5.11)
RDW: 14.5 % (ref 11.5–15.5)
WBC: 5.5 10*3/uL (ref 4.0–10.5)

## 2013-02-26 MED ORDER — ACETAMINOPHEN 325 MG PO TABS
650.0000 mg | ORAL_TABLET | Freq: Once | ORAL | Status: AC
  Administered 2013-02-26: 650 mg via ORAL
  Filled 2013-02-26: qty 2

## 2013-02-26 MED ORDER — SULFAMETHOXAZOLE-TRIMETHOPRIM 800-160 MG PO TABS: 1.0000 | ORAL_TABLET | Freq: Two times a day (BID) | ORAL | Status: DC

## 2013-02-26 MED ORDER — TRAMADOL HCL 50 MG PO TABS: 50.0000 mg | ORAL_TABLET | Freq: Four times a day (QID) | ORAL | Status: DC | PRN

## 2013-02-26 NOTE — ED Notes (Signed)
Vitals taken at 1240 just not documneted

## 2013-02-26 NOTE — ED Provider Notes (Signed)
CSN: [id number]     Arrival date & time 02/26/13  1235 History   First MD Initiated Contact with Patient 02/26/13 1545     Chief Complaint  Patient presents with  . multiple complaints    (Consider location/radiation/quality/duration/timing/severity/associated sxs/prior Treatment) The history is provided by the patient. No language interpreter was used.  patient presents with C/O  R flank pain. Chronic knee pain, and sxs of URI. Patient states she has developed worseing R flank pain over the last few days. Denies urinary sxs,, sciatica, injury. She has been using heat pack without relief. Denies fever, chills, myalgias.  Also c/o L knee pain. Worse  In the morning, after long periods of rest, in cold weather. Been present for years. Associated stiffness and occasional swelling. Denies sxs of dvt. Patient with ongoing cough after acute URI several weeks ago. Patient had one episode of post tussive vominting yesterday. Denies abdominal pain, nausea, diarrhea, constipation.  Past Medical History  Diagnosis Date  . Hypertension    History reviewed. No pertinent past surgical history. No family history on file. History  Substance Use Topics  . Smoking status: Never Smoker   . Smokeless tobacco: Not on file  . Alcohol Use: No   OB History   Grav Para Term Preterm Abortions TAB SAB Ect Mult Living                 Review of Systems Ten systems reviewed and are negative for acute change, except as noted in the HPI.   Allergies  Review of patient's allergies indicates no known allergies.  Home Medications   Current Outpatient Rx  Name  Route  Sig  Dispense  Refill  . lisinopril-hydrochlorothiazide (PRINZIDE,ZESTORETIC) 20-25 MG per tablet   Oral   Take 1 tablet by mouth daily.          BP 131/75  Pulse 60  Temp(Src) 97.9 F (36.6 C) (Oral)  Resp 18  Ht 5\' 5"  (1.651 m)  Wt 248 lb (112.492 kg)  BMI 41.27 kg/m2  SpO2 97% Physical Exam Appears moderately ill but not  toxic; temperature as noted in vitals. Ears normal. Eyes:glassy appearance, no discharge  Heart: RRR, NO M/G/R Throat and pharynx normal.   Neck supple. No adenopathyhy in the neck.  Sinuses non tender.  The chest is clear. L knee without swelling. Heat or redness, ROM limited due to pain, antalgic gait and normal ligamentous stability Abdomen is soft and nontender, no cva tenderness  ED Course  Procedures (including critical care time) Labs Review Labs Reviewed  CBC WITH DIFFERENTIAL - Abnormal; Notable for the following:    Neutrophils Relative % 41 (*)    Lymphocytes Relative 50 (*)    All other components within normal limits  COMPREHENSIVE METABOLIC PANEL - Abnormal; Notable for the following:    Albumin 3.4 (*)    GFR calc non Af Amer 62 (*)    GFR calc Af Amer 72 (*)    All other components within normal limits  URINALYSIS, ROUTINE W REFLEX MICROSCOPIC - Abnormal; Notable for the following:    APPearance CLOUDY (*)    Leukocytes, UA LARGE (*)    All other components within normal limits  URINE MICROSCOPIC-ADD ON - Abnormal; Notable for the following:    Squamous Epithelial / LPF MANY (*)    Bacteria, UA MANY (*)    All other components within normal limits  URINE CULTURE   Imaging Review Dg Knee Complete 4 Views Left  02/26/2013  CLINICAL DATA:  Left knee pain and swelling.  EXAM: LEFT KNEE - COMPLETE 4+ VIEW  COMPARISON:  May 07, 2010.  FINDINGS: No fracture or dislocation is noted. No joint effusion is noted. Mild spurring of superior aspect of patella is noted. Mild narrowing is noted in the lateral joint space with moderate narrowing seen medially. No soft tissue abnormality or radiopaque foreign body is seen.  IMPRESSION: Moderate degenerative joint disease is noted. No acute abnormality seen in the left knee.   Electronically Signed   By: Roque Lias M.D.   On: 02/26/2013 17:19    EKG Interpretation   None       MDM   1. UTI (lower urinary tract  infection)   2. Degenerative arthritis of knee    Patient with OA, uti. Will d/c with bactrim and tramadol. F/u with pcp. Labs otherwise unremarkable. The patient appears reasonably screened and/or stabilized for discharge and I doubt any other medical condition or other [facility] requiring further screening, evaluation, or treatment in the ED at this time prior to discharge.     Arthor Captain, PA-C 03/03/13 1122

## 2013-02-26 NOTE — Discharge Instructions (Signed)
You have been seen today for your complaint of pain flank pain and knee pain. Your lab work showed urine infection. Your discharge medications include: 1) Bactirm Please take all of your antibiotics until finished!   You may develop abdominal discomfort or diarrhea from the antibiotic.  You may help offset this with probiotics which you can buy or get in yogurt. Do not eat  or take the probiotics until 2 hours after your antibiotic.  2) Tramadol This is a pain medication for your knee. It may take a few days for it to give you relief. It does not usually cause drowsiness. Home care instructions are as follows:  1) please drink plenty of water, avoid tea and beverages with caffeine like coffee or soda 2) if you are sexually active, ,make sure to urinate immediately after intercourse Follow up with: your doctor or the emergency department Please seek immediate medical care if you develop any of the following symptoms: SEEK MEDICAL CARE IF:  You have back pain.  You develop a fever.  Your symptoms do not begin to resolve within 3 days.  SEEK IMMEDIATE MEDICAL CARE IF:  You have severe back pain or lower abdominal pain.  You develop chills.  You have nausea or vomiting.  You have continued burning or discomfort with urination.  Wear and Tear Disorders of the Knee (Arthritis, Osteoarthritis) Everyone will experience wear and tear injuries (arthritis, osteoarthritis) of the knee. These are the changes we all get as we age. They come from the joint stress of daily living. The amount of cartilage damage in your knee and your symptoms determine if you need surgery. Mild problems require approximately two months recovery time. More severe problems take several months to recover. With mild problems, your surgeon may find worn and rough cartilage surfaces. With severe changes, your surgeon may find cartilage that has completely worn away and exposed the bone. Loose bodies of bone and cartilage, bone spurs  (excess bone growth), and injuries to the menisci (cushions between the large bones of your leg) are also common. All of these problems can cause pain. For a mild wear and tear problem, rough cartilage may simply need to be shaved and smoothed. For more severe problems with areas of exposed bone, your surgeon may use an instrument for roughing up the bone surfaces to stimulate new cartilage growth. Loose bodies are usually removed. Torn menisci may be trimmed or repaired. ABOUT THE ARTHROSCOPIC PROCEDURE Arthroscopy is a surgical technique. It allows your orthopedic surgeon to diagnose and treat your knee injury with accuracy. The surgeon looks into your knee through a small scope. The scope is like a small (pencil-sized) telescope. Arthroscopy is less invasive than open knee surgery. You can expect a more rapid recovery. After the procedure, you will be moved to a recovery area until most of the effects of the medication have worn off. Your caregiver will discuss the test results with you. RECOVERY The severity of the arthritis and the type of procedure performed will determine recovery time. Other important factors include age, physical condition, medical conditions, and the type of rehabilitation program. Strengthening your muscles after arthroscopy helps guarantee a better recovery. Follow your caregiver's instructions. Use crutches, rest, elevate, ice, and do knee exercises as instructed. Your caregivers will help you and instruct you with exercises and other physical therapy required to regain your mobility, muscle strength, and functioning following surgery. Only take over-the-counter or prescription medicines for pain, discomfort, or fever as directed by your caregiver.  SEEK MEDICAL CARE IF:   There is increased bleeding (more than a small spot) from the wound.  You notice redness, swelling, or increasing pain in the wound.  Pus is coming from wound.  You develop an unexplained oral  temperature above 102 F (38.9 C) , or as your caregiver suggests.  You notice a foul smell coming from the wound or dressing.  You have severe pain with motion of the knee. SEEK IMMEDIATE MEDICAL CARE IF:   You develop a rash.  You have difficulty breathing.  You have any allergic problems. MAKE SURE YOU:   Understand these instructions.  Will watch your condition.  Will get help right away if you are not doing well or get worse. Document Released: 02/02/2000 Document Revised: 04/29/2011 Document Reviewed: 07/01/2007 [facility] Patient Information 2014 [place], [place].

## 2013-02-26 NOTE — ED Notes (Signed)
The pt has had  Lt knee lt thigh and lt abd pain for  2 months.  She vomited yesterday.  lmp none

## 2013-02-27 LAB — URINE CULTURE: Colony Count: 55000

## 2013-03-01 ENCOUNTER — Other Ambulatory Visit (HOSPITAL_COMMUNITY): Payer: Self-pay | Admitting: Internal Medicine

## 2013-03-01 ENCOUNTER — Ambulatory Visit (HOSPITAL_COMMUNITY)
Admission: RE | Admit: 2013-03-01 | Discharge: 2013-03-01 | Disposition: A | Discharge status: Home / Self Care | Payer: No Typology Code available for payment source | Source: Ambulatory Visit | Attending: Internal Medicine | Admitting: Internal Medicine

## 2013-03-01 DIAGNOSIS — R0602 Shortness of breath: Secondary | ICD-10-CM

## 2013-03-05 NOTE — ED Provider Notes (Signed)
History/physical exam/procedure(s) were performed by non-physician practitioner and as supervising physician I was immediately available for consultation/collaboration. I have reviewed all notes and am in agreement with care and plan.   Jazlynn Nemetz S Legna Mausolf, MD 03/05/13 2340 

## 2013-09-05 ENCOUNTER — Emergency Department (HOSPITAL_COMMUNITY): Payer: No Typology Code available for payment source

## 2013-09-05 ENCOUNTER — Emergency Department (HOSPITAL_COMMUNITY)
Admission: EM | Admit: 2013-09-05 | Discharge: 2013-09-05 | Disposition: A | Discharge status: Home / Self Care | Payer: No Typology Code available for payment source | Attending: Emergency Medicine | Admitting: Emergency Medicine

## 2013-09-05 ENCOUNTER — Encounter (HOSPITAL_COMMUNITY): Payer: Self-pay | Admitting: Emergency Medicine

## 2013-09-05 DIAGNOSIS — R0602 Shortness of breath: Secondary | ICD-10-CM | POA: Insufficient documentation

## 2013-09-05 DIAGNOSIS — Y929 Unspecified place or not applicable: Secondary | ICD-10-CM | POA: Insufficient documentation

## 2013-09-05 DIAGNOSIS — S0990XA Unspecified injury of head, initial encounter: Secondary | ICD-10-CM | POA: Insufficient documentation

## 2013-09-05 DIAGNOSIS — S0993XA Unspecified injury of face, initial encounter: Secondary | ICD-10-CM | POA: Insufficient documentation

## 2013-09-05 DIAGNOSIS — Y9389 Activity, other specified: Secondary | ICD-10-CM | POA: Insufficient documentation

## 2013-09-05 DIAGNOSIS — I10 Essential (primary) hypertension: Secondary | ICD-10-CM | POA: Insufficient documentation

## 2013-09-05 DIAGNOSIS — IMO0002 Reserved for concepts with insufficient information to code with codable children: Secondary | ICD-10-CM | POA: Insufficient documentation

## 2013-09-05 DIAGNOSIS — S46909A Unspecified injury of unspecified muscle, fascia and tendon at shoulder and upper arm level, unspecified arm, initial encounter: Secondary | ICD-10-CM | POA: Insufficient documentation

## 2013-09-05 DIAGNOSIS — S199XXA Unspecified injury of neck, initial encounter: Secondary | ICD-10-CM

## 2013-09-05 DIAGNOSIS — S3981XA Other specified injuries of abdomen, initial encounter: Secondary | ICD-10-CM | POA: Insufficient documentation

## 2013-09-05 DIAGNOSIS — S4980XA Other specified injuries of shoulder and upper arm, unspecified arm, initial encounter: Secondary | ICD-10-CM | POA: Insufficient documentation

## 2013-09-05 DIAGNOSIS — M79602 Pain in left arm: Secondary | ICD-10-CM

## 2013-09-05 LAB — CBC
HCT: 35.6 % — ABNORMAL LOW (ref 36.0–46.0)
HEMOGLOBIN: 11.9 g/dL — AB (ref 12.0–15.0)
MCH: 29.7 pg (ref 26.0–34.0)
MCHC: 33.4 g/dL (ref 30.0–36.0)
MCV: 88.8 fL (ref 78.0–100.0)
Platelets: 229 10*3/uL (ref 150–400)
RBC: 4.01 MIL/uL (ref 3.87–5.11)
RDW: 14.3 % (ref 11.5–15.5)
WBC: 5.6 10*3/uL (ref 4.0–10.5)

## 2013-09-05 LAB — COMPREHENSIVE METABOLIC PANEL
ALBUMIN: 3.4 g/dL — AB (ref 3.5–5.2)
ALK PHOS: 54 U/L (ref 39–117)
ALT: 15 U/L (ref 0–35)
AST: 20 U/L (ref 0–37)
Anion gap: 16 — ABNORMAL HIGH (ref 5–15)
BUN: 10 mg/dL (ref 6–23)
CO2: 25 mEq/L (ref 19–32)
Calcium: 9 mg/dL (ref 8.4–10.5)
Chloride: 101 mEq/L (ref 96–112)
Creatinine, Ser: 0.95 mg/dL (ref 0.50–1.10)
GFR calc Af Amer: 73 mL/min — ABNORMAL LOW (ref >90)
GFR calc non Af Amer: 63 mL/min — ABNORMAL LOW (ref >90)
GLUCOSE: 99 mg/dL (ref 70–99)
POTASSIUM: 3.5 meq/L — AB (ref 3.7–5.3)
SODIUM: 142 meq/L (ref 137–147)
TOTAL PROTEIN: 7.3 g/dL (ref 6.0–8.3)
Total Bilirubin: <0.2 mg/dL — ABNORMAL LOW (ref 0.3–1.2)

## 2013-09-05 LAB — SAMPLE TO BLOOD BANK

## 2013-09-05 LAB — PROTIME-INR
INR: 0.89 (ref 0.00–1.49)
Prothrombin Time: 12 seconds (ref 11.6–15.2)

## 2013-09-05 LAB — ETHANOL: Alcohol, Ethyl (B): 24 mg/dL — ABNORMAL HIGH (ref 0–11)

## 2013-09-05 MED ORDER — SODIUM CHLORIDE 0.9 % IV BOLUS (SEPSIS)
1000.0000 mL | Freq: Once | INTRAVENOUS | Status: AC
  Administered 2013-09-05: 1000 mL via INTRAVENOUS

## 2013-09-05 MED ORDER — MORPHINE SULFATE 4 MG/ML IJ SOLN
4.0000 mg | Freq: Once | INTRAMUSCULAR | Status: AC
  Administered 2013-09-05: 4 mg via INTRAVENOUS
  Filled 2013-09-05: qty 1

## 2013-09-05 MED ORDER — IOHEXOL 300 MG/ML  SOLN
100.0000 mL | Freq: Once | INTRAMUSCULAR | Status: AC | PRN
  Administered 2013-09-05: 100 mL via INTRAVENOUS
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
## 2013-09-05 NOTE — ED Notes (Signed)
Pt ambulated outside of room with no assistance.

## 2013-09-06 LAB — CDS SEROLOGY

## 2013-10-19 ENCOUNTER — Other Ambulatory Visit: Payer: Self-pay | Admitting: Internal Medicine

## 2013-10-19 DIAGNOSIS — Z1231 Encounter for screening mammogram for malignant neoplasm of breast: Secondary | ICD-10-CM

## 2013-11-01 ENCOUNTER — Encounter (INDEPENDENT_AMBULATORY_CARE_PROVIDER_SITE_OTHER): Payer: Self-pay

## 2013-11-01 ENCOUNTER — Ambulatory Visit
Admission: RE | Admit: 2013-11-01 | Discharge: 2013-11-01 | Disposition: A | Discharge status: Home / Self Care | Payer: No Typology Code available for payment source | Source: Ambulatory Visit | Attending: Internal Medicine | Admitting: Internal Medicine

## 2013-11-01 DIAGNOSIS — Z1231 Encounter for screening mammogram for malignant neoplasm of breast: Secondary | ICD-10-CM

## 2013-11-02 ENCOUNTER — Ambulatory Visit: Payer: No Typology Code available for payment source

## 2013-11-11 ENCOUNTER — Ambulatory Visit (HOSPITAL_COMMUNITY)
Admission: RE | Admit: 2013-11-11 | Discharge: 2013-11-11 | Disposition: A | Discharge status: Home / Self Care | Payer: No Typology Code available for payment source | Source: Ambulatory Visit | Attending: Internal Medicine | Admitting: Internal Medicine

## 2013-11-11 ENCOUNTER — Other Ambulatory Visit (HOSPITAL_COMMUNITY): Payer: Self-pay | Admitting: Internal Medicine

## 2013-11-11 DIAGNOSIS — R079 Chest pain, unspecified: Secondary | ICD-10-CM

## 2013-11-12 ENCOUNTER — Other Ambulatory Visit (HOSPITAL_COMMUNITY): Payer: Self-pay | Admitting: Internal Medicine

## 2013-11-12 DIAGNOSIS — R079 Chest pain, unspecified: Secondary | ICD-10-CM

## 2013-11-15 ENCOUNTER — Other Ambulatory Visit (HOSPITAL_COMMUNITY): Payer: No Typology Code available for payment source

## 2013-11-16 ENCOUNTER — Ambulatory Visit (HOSPITAL_COMMUNITY)
Admission: RE | Admit: 2013-11-16 | Discharge: 2013-11-16 | Disposition: A | Discharge status: Home / Self Care | Payer: No Typology Code available for payment source | Source: Ambulatory Visit | Attending: Internal Medicine | Admitting: Internal Medicine

## 2013-11-16 DIAGNOSIS — R079 Chest pain, unspecified: Secondary | ICD-10-CM | POA: Insufficient documentation

## 2013-12-02 ENCOUNTER — Encounter: Payer: Self-pay | Admitting: Interventional Cardiology

## 2013-12-02 ENCOUNTER — Ambulatory Visit (INDEPENDENT_AMBULATORY_CARE_PROVIDER_SITE_OTHER): Payer: No Typology Code available for payment source | Admitting: Interventional Cardiology

## 2013-12-02 VITALS — BP 160/100 | HR 94 | Ht 65.0 in | Wt 260.0 lb

## 2013-12-02 DIAGNOSIS — I10 Essential (primary) hypertension: Secondary | ICD-10-CM

## 2013-12-02 DIAGNOSIS — Z01810 Encounter for preprocedural cardiovascular examination: Secondary | ICD-10-CM

## 2013-12-02 DIAGNOSIS — R9431 Abnormal electrocardiogram [ECG] [EKG]: Secondary | ICD-10-CM | POA: Insufficient documentation

## 2013-12-02 NOTE — Patient Instructions (Signed)
Your physician has requested that you have a lexiscan myoview. For further information please visit [email or web address]. Please follow instruction sheet, as given.  Your physician recommends that you continue on your current medications as directed. Please refer to the Current Medication list given to you today.  

## 2013-12-02 NOTE — Progress Notes (Signed)
Patient ID: Valerie Cowan, female   DOB: 09/02/1950, 63 y.o.   MRN: [id number]     Patient ID: Valerie DunnJacqueline H Cowan MRN: [id number] DOB/AGE: 63/10/1950 63 y.o.   [facility]; Dr. Roseanne RenoHassan   Reason for Consultation preoperative evaluation  HPI: 63 y/o who has never had any heart problems.  She requires shoulder surgery and is here for preoperative clearance.  Her most strenuous activity is at work.  She lifts patient.  She does walk and has no CP or SHOB.  Her walking is now limited by back pain.    She has some pain in the epigastric/lower chest area as well.  No clear triggers or alleviating factors.   Current Outpatient Prescriptions  Medication Sig Dispense Refill  . alprazolam (XANAX) 2 MG tablet Take 2 mg by mouth at bedtime as needed for sleep.      Marland Kitchen. HYDROcodone-acetaminophen (NORCO) 10-325 MG per tablet Take 1 tablet by mouth every 6 (six) hours as needed.      Marland Kitchen. lisinopril-hydrochlorothiazide (PRINZIDE,ZESTORETIC) 20-25 MG per tablet Take 1 tablet by mouth daily.      . traMADol (ULTRAM) 50 MG tablet Take by mouth every 6 (six) hours as needed.       No current facility-administered medications for this visit.   Past Medical History  Diagnosis Date  . Hypertension   . Arthritis   . Anxiety   . Depression   . Heart murmur     Family History  Problem Relation Age of Onset  . Cancer Mother   . Cancer Father   . Heart murmur Son   . Cancer Sister     History   Social History  . Marital Status: Single    Spouse Name: N/A    Number of Children: N/A  . Years of Education: N/A   Occupational History  . Not on file.   Social History Main Topics  . Smoking status: Never Smoker   . Smokeless tobacco: Not on file  . Alcohol Use: No  . Drug Use: Not on file  . Sexual Activity: Not on file   Other Topics Concern  . Not on file   Social History Narrative  . No narrative on file    Past Surgical History  Procedure  Laterality Date  . Abdominal hysterectomy      19      (Not in a hospital admission)  Review of systems complete and found to be negative unless listed above .  No nausea, vomiting.  No fever chills, No focal weakness,  No palpitations.  Physical Exam: Filed Vitals:   12/02/13 1316  BP: 160/100  Pulse: 94    Weight: 260 lb (117.935 kg)  Physical exam:  [place]/AT EOMI No JVD, No carotid bruit RRR S1S2  No wheezing Soft. NT, nondistended No edema. No focal motor or sensory deficits Normal affect  Labs:   Lab Results  Component Value Date   WBC 5.6 09/05/2013   HGB 11.9* 09/05/2013   HCT 35.6* 09/05/2013   MCV 88.8 09/05/2013   PLT 229 09/05/2013   No results found for this basename: NA, K, CL, CO2, BUN, CREATININE, CALCIUM, LABALBU, PROT, BILITOT, ALKPHOS, ALT, AST, GLUCOSE,  in the last 168 hours No results found for this basename: CKTOTAL, CKMB, CKMBINDEX, TROPONINI    Lab Results  Component Value Date   CHOL 202* 05/09/2010   CHOL 215* 04/19/2008   Lab Results  Component Value Date   HDL 74  05/09/2010   HDL 63 04/19/2008   Lab Results  Component Value Date   LDLCALC 116* 05/09/2010   LDLCALC 138* 04/19/2008   Lab Results  Component Value Date   TRIG 59 05/09/2010   TRIG 68 04/19/2008   Lab Results  Component Value Date   CHOLHDL 2.7 Ratio 05/09/2010   CHOLHDL 3.4 Ratio 04/19/2008   No results found for this basename: LDLDIRECT       EKG: NSR, anterior T wave inversions  ASSESSMENT AND PLAN:  Preoperative evaluation: Unable to do 4 METS of activity due to back pin.  Plan for pharmacologic stress test, especially given chest pain.  HTN:  Controlled at home.  COntrolled at PMD office (132/78 per hre report).  Elevated today.  Back pain: THis may limit her on the treadmill.   Signed:  Abnormal ECG: anterior T wave inversions  Fredric MareJay S. Tigerlily Christine, MD, [facility] 12/02/2013, 1:33 PM

## 2013-12-10 ENCOUNTER — Ambulatory Visit (HOSPITAL_COMMUNITY): Payer: No Typology Code available for payment source | Attending: Interventional Cardiology | Admitting: Radiology

## 2013-12-10 VITALS — BP 145/75 | HR 70 | Ht 65.0 in | Wt 259.0 lb

## 2013-12-10 DIAGNOSIS — R9431 Abnormal electrocardiogram [ECG] [EKG]: Secondary | ICD-10-CM | POA: Diagnosis not present

## 2013-12-10 DIAGNOSIS — R079 Chest pain, unspecified: Secondary | ICD-10-CM | POA: Diagnosis present

## 2013-12-10 DIAGNOSIS — Z01810 Encounter for preprocedural cardiovascular examination: Secondary | ICD-10-CM

## 2013-12-10 DIAGNOSIS — I10 Essential (primary) hypertension: Secondary | ICD-10-CM | POA: Insufficient documentation

## 2013-12-10 DIAGNOSIS — R0609 Other forms of dyspnea: Secondary | ICD-10-CM | POA: Diagnosis not present

## 2013-12-10 MED ORDER — TECHNETIUM TC 99M SESTAMIBI GENERIC - CARDIOLITE
33.0000 | Freq: Once | INTRAVENOUS | Status: AC | PRN
  Administered 2013-12-10: 33 via INTRAVENOUS

## 2013-12-10 MED ORDER — REGADENOSON 0.4 MG/5ML IV SOLN
0.4000 mg | Freq: Once | INTRAVENOUS | Status: AC
  Administered 2013-12-10: 0.4 mg via INTRAVENOUS

## 2013-12-10 NOTE — Progress Notes (Signed)
[facility] SITE 3 NUCLEAR MED [address] [place], [place] [address] [phone]    Cardiology Nuclear Med Study  Miquel DunnJacqueline H Cowan is a 63 y.o. female     MRN : [id number]     DOB: 08/10/1950  Procedure Date: 12/10/2013  Nuclear Med Background Indication for Stress Test:  Evaluation for Ischemia, Surgical Clearance and Abnormal EKG History:  no prior cardiac hx or testing Cardiac Risk Factors: Hypertension  Symptoms:  Chest Pain and DOE   Nuclear Pre-Procedure Caffeine/Decaff Intake:  None NPO After: 6:00pm   Lungs:  clear O2 Sat: 91% on room air. IV 0.9% NS with Angio Cath:  22g  IV Site: R Hand  IV Started by:  Frederick Peerseresa Bivens, EMT-P  Chest Size (in):  36 Cup Size: C  Height: 5\' 5"  (1.651 m)  Weight:  259 lb (117.482 kg)  BMI:  Body mass index is 43.1 kg/(m^2). Tech Comments:  n/a    Nuclear Med Study 1 or 2 day study: 2 day  Stress Test Type:  Lexiscan  Reading MD: n/a  Order Authorizing Provider:  Catalina GravelJ. Varanasi, MD  Resting Radionuclide: Technetium 7130m Sestamibi  Resting Radionuclide Dose: 33.0 mCi on 12/14/2013  Stress Radionuclide:  Technetium 1530m Sestamibi  Stress Radionuclide Dose: 33.0 mCi on 12/10/2013          Stress Protocol Rest HR: 70 Stress HR: 100  Rest BP: 145/75 Stress BP: 145/69  Exercise Time (min): n/a METS: n/a   Predicted Max HR: 157 bpm % Max HR: 63.69 bpm Rate Pressure Product: [id number]   Dose of Adenosine (mg):  n/a Dose of Lexiscan: 0.4 mg  Dose of Atropine (mg): n/a Dose of Dobutamine: n/a mcg/kg/min (at max HR)  Stress Test Technologist: Nelson ChimesSharon Brooks, BS-ES  Nuclear Technologist:  Kerby NoraElzbieta Kubak, CNMT     Rest Procedure:  Myocardial perfusion imaging was performed at rest 45 minutes following the intravenous administration of Technetium 1430m Sestamibi. Rest ECG: NSR - Normal EKG  Stress Procedure:  The patient received IV Lexiscan 0.4 mg over 15-seconds.  Technetium 9430m Sestamibi injected at 30-seconds.  Quantitative  spect images were obtained after a 45 minute delay.  During the infusion of Lexiscan the patient complained of SOB, headache and lightheadedness.  These symptoms began to resolve in recovery.  Stress ECG: No significant change from baseline ECG  QPS Raw Data Images:  Normal; no motion artifact; normal heart/lung ratio. Stress Images:  There is decreased uptake in the lateral wall. Rest Images:  Normal homogeneous uptake in all areas of the myocardium. Subtraction (SDS):  These findings are consistent with ischemia. SDS 4. Extent 8%. Transient Ischemic Dilatation (Normal <1.22):  0.97 Lung/Heart Ratio (Normal <0.45):  0.30  Quantitative Gated Spect Images QGS EDV:  87 ml QGS ESV:  22 ml  Impression Exercise Capacity:  Lexiscan with no exercise. BP Response:  Hypotensive blood pressure response. Clinical Symptoms:  No chest pain. ECG Impression:  No significant ECG changes with Lexiscan. Comparison with Prior Nuclear Study: No previous nuclear study performed  Overall Impression:  Intermediate risk stress nuclear study with a moderate, mild intensity reversible lateral defect which could indicate diagonal or marginal branch ischemia.  LV Ejection Fraction: 74%.  LV Wall Motion:  Normal Wall Motion  Valerie NoseKenneth C. Hilty, MD, [facility] CenterFACC Board Certified in Nuclear Cardiology Attending Cardiologist [facility]

## 2013-12-14 ENCOUNTER — Ambulatory Visit (HOSPITAL_COMMUNITY): Payer: No Typology Code available for payment source | Attending: Interventional Cardiology

## 2013-12-14 DIAGNOSIS — R0989 Other specified symptoms and signs involving the circulatory and respiratory systems: Secondary | ICD-10-CM

## 2013-12-14 MED ORDER — TECHNETIUM TC 99M SESTAMIBI GENERIC - CARDIOLITE
33.0000 | Freq: Once | INTRAVENOUS | Status: AC | PRN
  Administered 2013-12-14: 33 via INTRAVENOUS

## 2013-12-20 ENCOUNTER — Telehealth: Payer: Self-pay | Admitting: Interventional Cardiology

## 2013-12-20 ENCOUNTER — Encounter: Payer: Self-pay | Admitting: *Deleted

## 2013-12-20 DIAGNOSIS — R079 Chest pain, unspecified: Secondary | ICD-10-CM

## 2013-12-20 NOTE — Telephone Encounter (Signed)
No call back from the patient today. I have spoken with Juliette AlcideMelinda since she will be in triage 11/3 and explained what is going on with the patient. I have placed her paperwork on top of the triage cart. Juliette AlcideMelinda will try to call the patient for me tomorrow and go over her instructions and get a time when she can come for labs this week. Will forward note to triage as well.

## 2013-12-20 NOTE — Telephone Encounter (Signed)
Cardiac cath scheduled for 11/6 with Dr. Eldridge DaceVaranasi. I attempted to call the patient with her instructions. She states that now is not a good time to talk and she would like a call back in about 30 minutes. I will try her back around that time.

## 2013-12-20 NOTE — Telephone Encounter (Signed)
I spoke with the patient. She is not getting good service right now. I have asked her to call back and ask for me when she has a better connection.

## 2013-12-21 NOTE — Telephone Encounter (Signed)
Spoke with patient and she has agreed to proceed with cardiac cath as scheduled.  Instructions reviewed with patient, verbalized understanding.  Scheduled labs for tomorrow, advised patient could come to office between 8-4 Instructions for cath left at front desk for pick up tomorrow

## 2013-12-22 ENCOUNTER — Other Ambulatory Visit (INDEPENDENT_AMBULATORY_CARE_PROVIDER_SITE_OTHER): Payer: No Typology Code available for payment source | Admitting: *Deleted

## 2013-12-22 DIAGNOSIS — R0602 Shortness of breath: Secondary | ICD-10-CM

## 2013-12-22 DIAGNOSIS — R9431 Abnormal electrocardiogram [ECG] [EKG]: Secondary | ICD-10-CM

## 2013-12-22 DIAGNOSIS — I10 Essential (primary) hypertension: Secondary | ICD-10-CM

## 2013-12-22 LAB — CBC WITH DIFFERENTIAL/PLATELET
Basophils Absolute: 0 10*3/uL (ref 0.0–0.1)
Basophils Relative: 0.4 % (ref 0.0–3.0)
Eosinophils Absolute: 0.1 10*3/uL (ref 0.0–0.7)
Eosinophils Relative: 1.2 % (ref 0.0–5.0)
HEMATOCRIT: 38.2 % (ref 36.0–46.0)
HEMOGLOBIN: 12.6 g/dL (ref 12.0–15.0)
LYMPHS ABS: 2.3 10*3/uL (ref 0.7–4.0)
Lymphocytes Relative: 46.4 % — ABNORMAL HIGH (ref 12.0–46.0)
MCHC: 32.9 g/dL (ref 30.0–36.0)
MCV: 89.5 fl (ref 78.0–100.0)
MONO ABS: 0.6 10*3/uL (ref 0.1–1.0)
Monocytes Relative: 11.8 % (ref 3.0–12.0)
NEUTROS ABS: 2 10*3/uL (ref 1.4–7.7)
Neutrophils Relative %: 40.2 % — ABNORMAL LOW (ref 43.0–77.0)
PLATELETS: 223 10*3/uL (ref 150.0–400.0)
RBC: 4.27 Mil/uL (ref 3.87–5.11)
RDW: 15.6 % — AB (ref 11.5–15.5)
WBC: 5.1 10*3/uL (ref 4.0–10.5)

## 2013-12-22 LAB — BASIC METABOLIC PANEL
BUN: 13 mg/dL (ref 6–23)
CHLORIDE: 106 meq/L (ref 96–112)
CO2: 28 mEq/L (ref 19–32)
Calcium: 9.2 mg/dL (ref 8.4–10.5)
Creatinine, Ser: 0.9 mg/dL (ref 0.4–1.2)
GFR: 83.42 mL/min (ref >60.00)
GLUCOSE: 104 mg/dL — AB (ref 70–99)
POTASSIUM: 4.2 meq/L (ref 3.5–5.1)
SODIUM: 141 meq/L (ref 135–145)

## 2013-12-22 LAB — PROTIME-INR
INR: 1 ratio (ref 0.8–1.0)
Prothrombin Time: 11.4 s (ref 9.6–13.1)

## 2013-12-22 LAB — APTT: APTT: 27.2 s (ref 23.4–32.7)

## 2013-12-24 ENCOUNTER — Encounter (HOSPITAL_COMMUNITY): Payer: Self-pay | Admitting: Interventional Cardiology

## 2013-12-24 ENCOUNTER — Encounter (HOSPITAL_COMMUNITY)
Admission: RE | Disposition: A | Discharge status: Home / Self Care | Payer: Self-pay | Source: Ambulatory Visit | Attending: Interventional Cardiology

## 2013-12-24 ENCOUNTER — Ambulatory Visit (HOSPITAL_COMMUNITY)
Admission: RE | Admit: 2013-12-24 | Discharge: 2013-12-24 | Disposition: A | Discharge status: Home / Self Care | Payer: No Typology Code available for payment source | Source: Ambulatory Visit | Attending: Interventional Cardiology | Admitting: Interventional Cardiology

## 2013-12-24 DIAGNOSIS — R9439 Abnormal result of other cardiovascular function study: Secondary | ICD-10-CM | POA: Diagnosis not present

## 2013-12-24 DIAGNOSIS — M549 Dorsalgia, unspecified: Secondary | ICD-10-CM | POA: Diagnosis not present

## 2013-12-24 DIAGNOSIS — I10 Essential (primary) hypertension: Secondary | ICD-10-CM | POA: Diagnosis not present

## 2013-12-24 DIAGNOSIS — Z79891 Long term (current) use of opiate analgesic: Secondary | ICD-10-CM | POA: Insufficient documentation

## 2013-12-24 HISTORY — DX: Abnormal result of other cardiovascular function study: R94.39

## 2013-12-24 HISTORY — PX: LEFT HEART CATHETERIZATION WITH CORONARY ANGIOGRAM: SHX5451

## 2013-12-24 SURGERY — LEFT HEART CATHETERIZATION WITH CORONARY ANGIOGRAM
Anesthesia: LOCAL

## 2013-12-24 MED ORDER — SODIUM CHLORIDE 0.9 % IV SOLN
INTRAVENOUS | Status: DC
  Administered 2013-12-24: 09:00:00 via INTRAVENOUS

## 2013-12-24 MED ORDER — SODIUM CHLORIDE 0.9 % IJ SOLN: 3.0000 mL | INTRAMUSCULAR | Status: DC | PRN

## 2013-12-24 MED ORDER — SODIUM CHLORIDE 0.9 % IV SOLN: 1.0000 mL/kg/h | INTRAVENOUS | Status: AC

## 2013-12-24 MED ORDER — MIDAZOLAM HCL 2 MG/2ML IJ SOLN
INTRAMUSCULAR | Status: AC
  Filled 2013-12-24: qty 2

## 2013-12-24 MED ORDER — NITROGLYCERIN 1 MG/10 ML FOR IR/CATH LAB
INTRA_ARTERIAL | Status: AC
  Filled 2013-12-24: qty 10

## 2013-12-24 MED ORDER — SODIUM CHLORIDE 0.9 % IJ SOLN: 3.0000 mL | Freq: Two times a day (BID) | INTRAMUSCULAR | Status: DC

## 2013-12-24 MED ORDER — HEPARIN (PORCINE) IN NACL 2-0.9 UNIT/ML-% IJ SOLN
INTRAMUSCULAR | Status: AC
  Filled 2013-12-24: qty 1000

## 2013-12-24 MED ORDER — HYDROCODONE-ACETAMINOPHEN 5-325 MG PO TABS
1.0000 | ORAL_TABLET | Freq: Once | ORAL | Status: AC
Start: 2013-12-24 — End: 2013-12-24
  Administered 2013-12-24: 2 via ORAL

## 2013-12-24 MED ORDER — SODIUM CHLORIDE 0.9 % IV SOLN: 250.0000 mL | INTRAVENOUS | Status: DC | PRN

## 2013-12-24 MED ORDER — ASPIRIN 81 MG PO CHEW
CHEWABLE_TABLET | ORAL | Status: AC
  Filled 2013-12-24: qty 1

## 2013-12-24 MED ORDER — FENTANYL CITRATE 0.05 MG/ML IJ SOLN
INTRAMUSCULAR | Status: AC
  Filled 2013-12-24: qty 2

## 2013-12-24 MED ORDER — ASPIRIN 81 MG PO CHEW
81.0000 mg | CHEWABLE_TABLET | ORAL | Status: AC
  Administered 2013-12-24: 81 mg via ORAL

## 2013-12-24 MED ORDER — LIDOCAINE HCL (PF) 1 % IJ SOLN
INTRAMUSCULAR | Status: AC
  Filled 2013-12-24: qty 30

## 2013-12-24 MED ORDER — HYDROCODONE-ACETAMINOPHEN 5-325 MG PO TABS
ORAL_TABLET | ORAL | Status: AC
  Administered 2013-12-24: 2 via ORAL
  Filled 2013-12-24: qty 2

## 2013-12-24 NOTE — CV Procedure (Addendum)
    PROCEDURE:  Left heart catheterization with selective coronary angiography, abdominal aortogram.  INDICATIONS:  Abnormal stress test  The risks, benefits, and details of the procedure were explained to the patient.  The patient verbalized understanding and wanted to proceed.  Informed written consent was obtained.  PROCEDURE TECHNIQUE:  After Xylocaine anesthesia a 59F sheath was placed in the right femoral artery with a single anterior needle wall stick.   Left coronary angiography was done using a Judkins L4 guide catheter. Left heart cath was done with an AR1 catheter after trying to engage the RCA.  Wire crossed Ao valve and then we follwed with the catheter.  Right coronary angiography was done using an AL-1 guide catheter.  Abdominal aortogram was done using a pigtail catheter with a power injection of contrast. Manual compression was used for hemostasis..    CONTRAST:  Total of 130 cc.  COMPLICATIONS:  None.    HEMODYNAMICS:  Aortic pressure was 146/84; LV pressure was 149/8; LVEDP 21.  There was no gradient between the left ventricle and aorta.    ANGIOGRAPHIC DATA:   The left main coronary artery is a short vessel which is widely patent.  The left anterior descending artery is a large vessel which wraps around the apex. There is no significant atherosclerosis in the LAD system. There are several small diagonals which are widely.  The left circumflex artery is a large vessel which appears angiographically normal. There is a very small ramus which is patent. There is a very large first obtuse marginal which branches across the lateral wall. The circumflex system is angiographically normal.  The right coronary artery is a medium-size vessel which is widely patent. The posterior descending artery is medium-sized and patent. The posterior lateral artery is medium-sized and patent.  LEFT VENTRICULOGRAM:  Left ventricular angiogram was not done.  LVEDP was 21 mmHg.  ABDOMINAL AORTOGRAM:  No abdominal aortic aneurysm. Bilateral single renal arteries which are widely patent.  IMPRESSIONS:  1. Normal left main coronary artery. 2. Widely patent left anterior descending artery and its branches. 3. Angiographically normal left circumflex artery and its branches. 4. Widely patent right coronary artery.  Difficult to engage vessel. AL-1 catheter was required. 5. Normal left ventricular systolic function.  LVEDP 21 mmHg.   6.  No abdominal aortic aneurysm. No renal artery stenosis.  RECOMMENDATION:  Continue aggressive primary prevention. No further cardiac testing is required before shoulder surgery. False positive nuclear stress test.    Will decrease her home dosage of hydrochlorothiazide from 50 mg daily down to 25 mg daily. Blood pressure was elevated at the end of the procedure.  Consider adding amlodipine if blood pressure remains elevated.

## 2013-12-24 NOTE — Progress Notes (Signed)
Site area: right groin a 5 french sheath was removed  Site Prior to Removal:  Level 0  Pressure Applied For 20 MINUTES    Minutes Beginning at 1150a  Manual:   Yes.    Patient Status During Pull:  stable  Post Pull Groin Site:  Level 0  Post Pull Instructions Given:  Yes.    Post Pull Pulses Present:  Yes.    Dressing Applied:  Yes.    Comments:  VS remain stable during sheath pull.  Pt denies any pain at this site.

## 2013-12-24 NOTE — Discharge Instructions (Signed)
Follow post cath instructions.  Avoid lifting more than 10 lbs for a week. Avoid straining.    Angiogram, Care After Refer to this sheet in the next few weeks. These instructions provide you with information on caring for yourself after your procedure. Your health care provider may also give you more specific instructions. Your treatment has been planned according to current medical practices, but problems sometimes occur. Call your health care provider if you have any problems or questions after your procedure.  WHAT TO EXPECT AFTER THE PROCEDURE After your procedure, it is typical to have the following sensations:  Minor discomfort or tenderness and a small bump at the catheter insertion site. The bump should usually decrease in size and tenderness within 1 to 2 weeks.  Any bruising will usually fade within 2 to 4 weeks. HOME CARE INSTRUCTIONS   You may need to keep taking blood thinners if they were prescribed for you. Take medicines only as directed by your health care provider.  Do not apply powder or lotion to the site.  Do not take baths, swim, or use a hot tub until your health care provider approves.  You may shower 24 hours after the procedure. Remove the bandage (dressing) and gently wash the site with plain soap and water. Gently pat the site dry.  Inspect the site at least twice daily.  Limit your activity for the first 48 hours. Do not bend, squat, or lift anything over 20 lb (9 kg) or as directed by your health care provider.  Plan to have someone take you home after the procedure. Follow instructions about when you can drive or return to work. SEEK MEDICAL CARE IF:  You get light-headed when standing up.  You have drainage (other than a small amount of blood on the dressing).  You have chills.  You have a fever.  You have redness, warmth, swelling, or pain at the insertion site. SEEK IMMEDIATE MEDICAL CARE IF:   You develop chest pain or shortness of breath,  feel faint, or pass out.  You have bleeding, swelling larger than a walnut, or drainage from the catheter insertion site.  You develop pain, discoloration, coldness, or severe bruising in the leg or arm that held the catheter.  You develop bleeding from any other place, such as the bowels. You may see bright red blood in your urine or stools, or your stools may appear black and tarry.  You have heavy bleeding from the site. If this happens, hold pressure on the site. MAKE SURE YOU:  Understand these instructions.  Will watch your condition.  Will get help right away if you are not doing well or get worse. Document Released: 08/23/2004 Document Revised: 06/21/2013 Document Reviewed: 06/29/2012 [facility] Patient Information 2015 [employer] [place]. This information is not intended to replace advice given to you by your health care provider. Make sure you discuss any questions you have with your health care provider.

## 2013-12-24 NOTE — H&P (View-Only) (Signed)
Patient ID: Miquel DunnJacqueline H Moncayo, female   DOB: 09/02/1950, 63 y.o.   MRN: [id number]     Patient ID: Miquel DunnJacqueline H Pauls MRN: [id number] DOB/AGE: 63/10/1950 63 y.o.   [facility]; Dr. Roseanne RenoHassan   Reason for Consultation preoperative evaluation  HPI: 63 y/o who has never had any heart problems.  She requires shoulder surgery and is here for preoperative clearance.  Her most strenuous activity is at work.  She lifts patient.  She does walk and has no CP or SHOB.  Her walking is now limited by back pain.    She has some pain in the epigastric/lower chest area as well.  No clear triggers or alleviating factors.   Current Outpatient Prescriptions  Medication Sig Dispense Refill  . alprazolam (XANAX) 2 MG tablet Take 2 mg by mouth at bedtime as needed for sleep.      Marland Kitchen. HYDROcodone-acetaminophen (NORCO) 10-325 MG per tablet Take 1 tablet by mouth every 6 (six) hours as needed.      Marland Kitchen. lisinopril-hydrochlorothiazide (PRINZIDE,ZESTORETIC) 20-25 MG per tablet Take 1 tablet by mouth daily.      . traMADol (ULTRAM) 50 MG tablet Take by mouth every 6 (six) hours as needed.       No current facility-administered medications for this visit.   Past Medical History  Diagnosis Date  . Hypertension   . Arthritis   . Anxiety   . Depression   . Heart murmur     Family History  Problem Relation Age of Onset  . Cancer Mother   . Cancer Father   . Heart murmur Son   . Cancer Sister     History   Social History  . Marital Status: Single    Spouse Name: N/A    Number of Children: N/A  . Years of Education: N/A   Occupational History  . Not on file.   Social History Main Topics  . Smoking status: Never Smoker   . Smokeless tobacco: Not on file  . Alcohol Use: No  . Drug Use: Not on file  . Sexual Activity: Not on file   Other Topics Concern  . Not on file   Social History Narrative  . No narrative on file    Past Surgical History  Procedure  Laterality Date  . Abdominal hysterectomy      19      (Not in a hospital admission)  Review of systems complete and found to be negative unless listed above .  No nausea, vomiting.  No fever chills, No focal weakness,  No palpitations.  Physical Exam: Filed Vitals:   12/02/13 1316  BP: 160/100  Pulse: 94    Weight: 260 lb (117.935 kg)  Physical exam:  [place]/AT EOMI No JVD, No carotid bruit RRR S1S2  No wheezing Soft. NT, nondistended No edema. No focal motor or sensory deficits Normal affect  Labs:   Lab Results  Component Value Date   WBC 5.6 09/05/2013   HGB 11.9* 09/05/2013   HCT 35.6* 09/05/2013   MCV 88.8 09/05/2013   PLT 229 09/05/2013   No results found for this basename: NA, K, CL, CO2, BUN, CREATININE, CALCIUM, LABALBU, PROT, BILITOT, ALKPHOS, ALT, AST, GLUCOSE,  in the last 168 hours No results found for this basename: CKTOTAL, CKMB, CKMBINDEX, TROPONINI    Lab Results  Component Value Date   CHOL 202* 05/09/2010   CHOL 215* 04/19/2008   Lab Results  Component Value Date   HDL 74  05/09/2010   HDL 63 04/19/2008   Lab Results  Component Value Date   LDLCALC 116* 05/09/2010   LDLCALC 138* 04/19/2008   Lab Results  Component Value Date   TRIG 59 05/09/2010   TRIG 68 04/19/2008   Lab Results  Component Value Date   CHOLHDL 2.7 Ratio 05/09/2010   CHOLHDL 3.4 Ratio 04/19/2008   No results found for this basename: LDLDIRECT       EKG: NSR, anterior T wave inversions  ASSESSMENT AND PLAN:  Preoperative evaluation: Unable to do 4 METS of activity due to back pin.  Plan for pharmacologic stress test, especially given chest pain.  HTN:  Controlled at home.  COntrolled at PMD office (132/78 per hre report).  Elevated today.  Back pain: THis may limit her on the treadmill.   Signed:  Abnormal ECG: anterior T wave inversions  Fredric MareJay S. Anastazja Isaac, MD, [facility] IncFACC 12/02/2013, 1:33 PM

## 2013-12-24 NOTE — Interval H&P Note (Signed)
Cath Lab Visit (complete for each Cath Lab visit)  Clinical Evaluation Leading to the Procedure:   ACS: No.  Non-ACS:    Anginal Classification: CCS III  Anti-ischemic medical therapy: Minimal Therapy (1 class of medications)  Non-Invasive Test Results: Intermediate-risk stress test findings: cardiac mortality 1-3%/year  Prior CABG: No previous CABG      History and Physical Interval Note:  12/24/2013 10:36 AM  Valerie Cowan  has presented today for surgery, with the diagnosis of abnormal stress  The various methods of treatment have been discussed with the patient and family. After consideration of risks, benefits and other options for treatment, the patient has consented to  Procedure(s): LEFT HEART CATHETERIZATION WITH CORONARY ANGIOGRAM (N/A) as a surgical intervention .  The patient's history has been reviewed, patient examined, no change in status, stable for surgery.  I have reviewed the patient's chart and labs.  Questions were answered to the patient's satisfaction.     Erico Stan S.

## 2013-12-30 ENCOUNTER — Encounter: Payer: Self-pay | Admitting: Physician Assistant

## 2013-12-30 ENCOUNTER — Ambulatory Visit (INDEPENDENT_AMBULATORY_CARE_PROVIDER_SITE_OTHER): Payer: No Typology Code available for payment source | Admitting: Physician Assistant

## 2013-12-30 VITALS — BP 142/90 | HR 79 | Ht 65.0 in | Wt 262.0 lb

## 2013-12-30 DIAGNOSIS — E669 Obesity, unspecified: Secondary | ICD-10-CM

## 2013-12-30 DIAGNOSIS — R9439 Abnormal result of other cardiovascular function study: Secondary | ICD-10-CM

## 2013-12-30 DIAGNOSIS — I10 Essential (primary) hypertension: Secondary | ICD-10-CM

## 2013-12-30 NOTE — Assessment & Plan Note (Signed)
She'll be referred to a registered dietitian.  Contact information was provided

## 2013-12-30 NOTE — Patient Instructions (Addendum)
Your physician recommends that you continue on your current medications as directed. Please refer to the Current Medication list given to you today.  Your physician recommends that you  follow-up with Dr. Eldridge DaceVaranasi as needed.  Please call our office at [phone].

## 2013-12-30 NOTE — Assessment & Plan Note (Signed)
Test was left heart cath which revealed normal coronary arteries. Patient will follow up as needed with cardiology
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
## 2013-12-30 NOTE — Assessment & Plan Note (Signed)
Is mildly elevated. She reports her blood pressure in the last couple days systolic is between 131 and also 128 when checked.  No changes today.  She will follow-up with her primary care provider.

## 2014-01-20 ENCOUNTER — Telehealth: Payer: Self-pay | Admitting: Interventional Cardiology

## 2014-01-20 NOTE — Telephone Encounter (Signed)
Received request from Nurse fax box, documents faxed for surgical clearance. To: [facility] Fax number: [phone] Attention: 12.3.15/km

## 2014-01-27 ENCOUNTER — Ambulatory Visit (HOSPITAL_COMMUNITY)
Admission: RE | Admit: 2014-01-27 | Discharge: 2014-01-27 | Disposition: A | Discharge status: Home / Self Care | Payer: No Typology Code available for payment source | Source: Ambulatory Visit | Attending: Internal Medicine | Admitting: Internal Medicine

## 2014-01-27 ENCOUNTER — Other Ambulatory Visit (HOSPITAL_COMMUNITY): Payer: Self-pay | Admitting: Internal Medicine

## 2014-01-27 ENCOUNTER — Encounter (HOSPITAL_COMMUNITY): Payer: Self-pay | Admitting: Interventional Cardiology

## 2014-01-27 DIAGNOSIS — R0602 Shortness of breath: Secondary | ICD-10-CM

## 2014-12-24 ENCOUNTER — Other Ambulatory Visit: Payer: Self-pay

## 2014-12-24 DIAGNOSIS — Z1231 Encounter for screening mammogram for malignant neoplasm of breast: Secondary | ICD-10-CM

## 2015-01-31 ENCOUNTER — Ambulatory Visit: Payer: No Typology Code available for payment source

## 2015-04-21 ENCOUNTER — Encounter (HOSPITAL_COMMUNITY): Payer: Self-pay | Admitting: *Deleted

## 2015-04-21 ENCOUNTER — Emergency Department (HOSPITAL_COMMUNITY)
Admission: EM | Admit: 2015-04-21 | Discharge: 2015-04-21 | Disposition: A | Discharge status: Home / Self Care | Payer: BLUE CROSS/BLUE SHIELD | Attending: Emergency Medicine | Admitting: Emergency Medicine

## 2015-04-21 DIAGNOSIS — R05 Cough: Secondary | ICD-10-CM | POA: Insufficient documentation

## 2015-04-21 DIAGNOSIS — R52 Pain, unspecified: Secondary | ICD-10-CM | POA: Diagnosis present

## 2015-04-21 DIAGNOSIS — R011 Cardiac murmur, unspecified: Secondary | ICD-10-CM | POA: Insufficient documentation

## 2015-04-21 DIAGNOSIS — I10 Essential (primary) hypertension: Secondary | ICD-10-CM | POA: Insufficient documentation

## 2015-04-21 NOTE — ED Notes (Signed)
Reports generalized body aches and cough for 2 weeks

## 2015-04-21 NOTE — ED Notes (Signed)
No response from pt in lobby x 1. 

## 2015-04-21 NOTE — ED Notes (Signed)
No answer from pt in lobby x 3. 

## 2015-04-21 NOTE — ED Notes (Signed)
No answer from pt in lobby x 2. 

## 2015-05-23 ENCOUNTER — Ambulatory Visit
Admission: RE | Admit: 2015-05-23 | Discharge: 2015-05-23 | Disposition: A | Discharge status: Home / Self Care | Payer: BLUE CROSS/BLUE SHIELD | Source: Ambulatory Visit

## 2015-05-23 DIAGNOSIS — Z1231 Encounter for screening mammogram for malignant neoplasm of breast: Secondary | ICD-10-CM
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
## 2016-04-20 ENCOUNTER — Emergency Department (HOSPITAL_COMMUNITY)
Admission: EM | Admit: 2016-04-20 | Discharge: 2016-04-20 | Disposition: A | Discharge status: Home / Self Care | Payer: BLUE CROSS/BLUE SHIELD | Attending: Emergency Medicine | Admitting: Emergency Medicine

## 2016-04-20 ENCOUNTER — Encounter (HOSPITAL_COMMUNITY): Payer: Self-pay | Admitting: Emergency Medicine

## 2016-04-20 DIAGNOSIS — I10 Essential (primary) hypertension: Secondary | ICD-10-CM | POA: Insufficient documentation

## 2016-04-20 LAB — BASIC METABOLIC PANEL
Anion gap: 5 (ref 5–15)
BUN: 12 mg/dL (ref 6–20)
CO2: 31 mmol/L (ref 22–32)
Calcium: 8.9 mg/dL (ref 8.9–10.3)
Chloride: 104 mmol/L (ref 101–111)
Creatinine, Ser: 0.88 mg/dL (ref 0.44–1.00)
GFR calc Af Amer: >60 mL/min (ref >60)
GFR calc non Af Amer: >60 mL/min (ref >60)
GLUCOSE: 91 mg/dL (ref 65–99)
POTASSIUM: 3.6 mmol/L (ref 3.5–5.1)
Sodium: 140 mmol/L (ref 135–145)

## 2016-04-20 LAB — CBC
HCT: 37.1 % (ref 36.0–46.0)
Hemoglobin: 12.2 g/dL (ref 12.0–15.0)
MCH: 29 pg (ref 26.0–34.0)
MCHC: 32.9 g/dL (ref 30.0–36.0)
MCV: 88.1 fL (ref 78.0–100.0)
Platelets: 226 10*3/uL (ref 150–400)
RBC: 4.21 MIL/uL (ref 3.87–5.11)
RDW: 14.6 % (ref 11.5–15.5)
WBC: 5.7 10*3/uL (ref 4.0–10.5)

## 2016-04-20 MED ORDER — LISINOPRIL 40 MG PO TABS: 40.0000 mg | ORAL_TABLET | Freq: Every day | ORAL | 0 refills | Status: DC

## 2016-04-20 NOTE — ED Triage Notes (Signed)
Per pt, states her BP has been fluctuating the past couple of days-states has taken BP meds today-states headache

## 2016-04-20 NOTE — ED Notes (Signed)
PT DISCHARGED. INSTRUCTIONS AND PRESCRIPTION GIVEN. AAOX4. PT IN NO APPARENT DISTRESS OR PAIN. THE OPPORTUNITY TO ASK QUESTIONS WAS PROVIDED. 

## 2016-04-20 NOTE — ED Provider Notes (Signed)
[facility] Provider Note   CSN: [id number] Arrival date & time: 04/20/16  0950     History   Chief Complaint Chief Complaint  Patient presents with  . Hypertension    HPI Valerie Cowan is a 66 y.o. female.  HPI Pt has been having trouble with high blood pressure recently.  This started yesterday and it was elevated today again.  Her BP was in the 200s systolic.  Normally her BP is 130 to 140s.  The last couple of days have been much higher.   She does not have chest pain or shortness of breath.  Just a mild headache without numbness or weakness.  She took some alleve this am.  She did take her medications this am.  She has been on the same regimen for years.  No change in diet.  No supplement medications.  NO over the counter medications.  She has been taking cranberry juice.  More stress recently with work as well as a death in the family Past Medical History:  Diagnosis Date  . Abnormal stress test   . Anxiety   . Arthritis   . Depression   . Heart murmur   . Hypertension     Patient Active Problem List   Diagnosis Date Noted  . Abnormal stress test   . Abnormal ECG 12/02/2013  . BACK PAIN, LUMBAR 10/19/2008  . EDEMA 09/29/2008  . SHORTNESS OF BREATH 09/29/2008  . HYPERTENSION, BENIGN ESSENTIAL 04/19/2008  . VARICOSE VEINS, LOWER EXTREMITIES 04/19/2008  . Obesity 09/23/2007  . GERD 09/23/2007  . 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 ABDOMINAL HYSTERECTOMY     19  . LEFT HEART CATHETERIZATION WITH CORONARY ANGIOGRAM N/A 12/24/2013   Procedure: LEFT HEART CATHETERIZATION WITH CORONARY ANGIOGRAM;  Surgeon: Corky Crafts, MD;  Location: [facility] CATH LAB;  Service: Cardiovascular;  Laterality: N/A;    OB History    No data available       Home Medications    Prior to Admission medications   Medication Sig Start Date End Date Taking? Authorizing Provider  clindamycin (CLEOCIN) 150 MG  capsule Take 150 mg by mouth 2 (two) times daily. 04/19/15   Historical Provider, MD  hydrochlorothiazide (HYDRODIURIL) 25 MG tablet Take 25 mg by mouth daily.    Historical Provider, MD  HYDROcodone-acetaminophen (NORCO) 10-325 MG per tablet Take 1 tablet by mouth every 8 (eight) hours as needed (pain).     Historical Provider, MD  HYDROcodone-homatropine (HYCODAN) 5-1.5 MG/5ML syrup Take 5 mLs by mouth 3 (three) times daily as needed for cough.  04/19/15   Historical Provider, MD  lisinopril (PRINIVIL,ZESTRIL) 40 MG tablet Take 1 tablet (40 mg total) by mouth daily. 04/20/16   Linwood Dibbles, MD  sulfamethoxazole-trimethoprim (BACTRIM DS,SEPTRA DS) 800-160 MG tablet Take 1 tablet by mouth 2 (two) times daily.    Historical Provider, MD    Family History Family History  Problem Relation Age of Onset  . Cancer Mother   . Cancer Father   . Heart murmur Son   . Cancer Sister     Social History Social History  Substance Use Topics  . Smoking status: Never Smoker  . Smokeless tobacco: Not on file  . Alcohol use No     Allergies   Patient has no known allergies.   Review of Systems Review of Systems  All other systems reviewed and are negative.    Physical Exam Updated Vital  Signs BP 176/85 (BP Location: Right Arm)   Pulse 73   Temp 99.1 F (37.3 C) (Oral)   Resp 18   SpO2 90%   Physical Exam  Constitutional: She appears well-developed and well-nourished. No distress.  HENT:  Head: Normocephalic and atraumatic.  Right Ear: External ear normal.  Left Ear: External ear normal.  Eyes: Conjunctivae are normal. Right eye exhibits no discharge. Left eye exhibits no discharge. No scleral icterus.  Neck: Neck supple. No tracheal deviation present.  Cardiovascular: Normal rate, regular rhythm and intact distal pulses.   Pulmonary/Chest: Effort normal and breath sounds normal. No stridor. No respiratory distress. She has no wheezes. She has no rales.  Abdominal: Soft. Bowel sounds are  normal. She exhibits no distension. There is no tenderness. There is no rebound and no guarding.  Musculoskeletal: She exhibits no edema or tenderness.  Neurological: She is alert. She has normal strength. No cranial nerve deficit (no facial droop, extraocular movements intact, no slurred speech) or sensory deficit. She exhibits normal muscle tone. She displays no seizure activity. Coordination normal.  Skin: Skin is warm and dry. No rash noted.  Psychiatric: She has a normal mood and affect.  Nursing note and vitals reviewed.    ED Treatments / Results  Labs (all labs ordered are listed, but only abnormal results are displayed) Labs Reviewed  CBC  BASIC METABOLIC PANEL    EKG  EKG Interpretation  Date/Time:  Saturday April 20 2016 10:30:52 EST Ventricular Rate:  63 PR Interval:    QRS Duration: 87 QT Interval:  412 QTC Calculation: 422 R Axis:   29 Text Interpretation:  Sinus rhythm Borderline T wave abnormalities No significant change since last tracing Confirmed by Mulan Adan  MD-J, Dontaye Hur ([id number]) on 04/20/2016 10:34:11 AM       Procedures Procedures (including critical care time)  Medications Ordered in ED Medications - No data to display   Initial Impression / Assessment and Plan / ED Course  I have reviewed the triage vital signs and the nursing notes.  Pertinent labs & imaging results that were available during my care of the patient were reviewed by me and considered in my medical decision making (see chart for details).   patient presents to the emergency room for evaluation of hypertension.  Patient is not having any chest pain or shortness of breath. Her laboratory tests are reassuring. EKG does not show any acute changes. Her lungs are clear, I doubt pulmonary edema.  Will increase her lisinopril to 40 mg daily.  Follow up with PCP    Final Clinical Impressions(s) / ED Diagnoses   Final diagnoses:  Hypertension, unspecified type    New Prescriptions Current  Discharge Medication List       Linwood DibblesJon Reika Callanan, MD 04/20/16 1205

## 2016-04-20 NOTE — Discharge Instructions (Signed)
Increase your lisinopril to 40 mg daily, follow up with your doctor next week to check on your blood pressure

## 2016-05-06 ENCOUNTER — Other Ambulatory Visit: Payer: Self-pay | Admitting: Internal Medicine

## 2016-05-06 DIAGNOSIS — Z1231 Encounter for screening mammogram for malignant neoplasm of breast: Secondary | ICD-10-CM

## 2018-01-07 ENCOUNTER — Other Ambulatory Visit: Payer: Self-pay | Admitting: Internal Medicine

## 2018-01-07 DIAGNOSIS — Z1231 Encounter for screening mammogram for malignant neoplasm of breast: Secondary | ICD-10-CM

## 2018-05-08 ENCOUNTER — Encounter: Payer: Self-pay | Admitting: Gastroenterology

## 2018-08-27 DIAGNOSIS — I10 Essential (primary) hypertension: Secondary | ICD-10-CM | POA: Insufficient documentation

## 2019-07-06 ENCOUNTER — Encounter: Payer: Self-pay | Admitting: Gastroenterology

## 2019-08-13 ENCOUNTER — Ambulatory Visit: Payer: BLUE CROSS/BLUE SHIELD | Admitting: Gastroenterology

## 2019-10-12 ENCOUNTER — Ambulatory Visit: Payer: BLUE CROSS/BLUE SHIELD | Admitting: Gastroenterology

## 2020-07-07 ENCOUNTER — Ambulatory Visit (HOSPITAL_COMMUNITY)
Admission: EM | Admit: 2020-07-07 | Discharge: 2020-07-07 | Disposition: A | Discharge status: Home / Self Care | Payer: Medicare HMO | Attending: Medical Oncology | Admitting: Medical Oncology

## 2020-07-07 ENCOUNTER — Telehealth (HOSPITAL_COMMUNITY): Payer: Self-pay | Admitting: Medical Oncology

## 2020-07-07 ENCOUNTER — Ambulatory Visit (INDEPENDENT_AMBULATORY_CARE_PROVIDER_SITE_OTHER): Payer: Medicare HMO

## 2020-07-07 ENCOUNTER — Other Ambulatory Visit: Payer: Self-pay

## 2020-07-07 DIAGNOSIS — I10 Essential (primary) hypertension: Secondary | ICD-10-CM

## 2020-07-07 DIAGNOSIS — I440 Atrioventricular block, first degree: Secondary | ICD-10-CM | POA: Diagnosis not present

## 2020-07-07 DIAGNOSIS — I119 Hypertensive heart disease without heart failure: Secondary | ICD-10-CM | POA: Diagnosis not present

## 2020-07-07 DIAGNOSIS — R0602 Shortness of breath: Secondary | ICD-10-CM | POA: Diagnosis not present

## 2020-07-07 DIAGNOSIS — Z79899 Other long term (current) drug therapy: Secondary | ICD-10-CM | POA: Insufficient documentation

## 2020-07-07 DIAGNOSIS — R609 Edema, unspecified: Secondary | ICD-10-CM

## 2020-07-07 DIAGNOSIS — M7989 Other specified soft tissue disorders: Secondary | ICD-10-CM | POA: Diagnosis not present

## 2020-07-07 LAB — CBC
HCT: 40.4 % (ref 36.0–46.0)
Hemoglobin: 13 g/dL (ref 12.0–15.0)
MCH: 29.5 pg (ref 26.0–34.0)
MCHC: 32.2 g/dL (ref 30.0–36.0)
MCV: 91.8 fL (ref 80.0–100.0)
Platelets: 229 10*3/uL (ref 150–400)
RBC: 4.4 MIL/uL (ref 3.87–5.11)
RDW: 14.4 % (ref 11.5–15.5)
WBC: 6.2 10*3/uL (ref 4.0–10.5)
nRBC: 0 % (ref 0.0–0.2)

## 2020-07-07 LAB — BASIC METABOLIC PANEL
Anion gap: 7 (ref 5–15)
BUN: 8 mg/dL (ref 8–23)
CO2: 30 mmol/L (ref 22–32)
Calcium: 9 mg/dL (ref 8.9–10.3)
Chloride: 101 mmol/L (ref 98–111)
Creatinine, Ser: 0.89 mg/dL (ref 0.44–1.00)
GFR, Estimated: >60 mL/min (ref >60)
Glucose, Bld: 90 mg/dL (ref 70–99)
Potassium: 3.7 mmol/L (ref 3.5–5.1)
Sodium: 138 mmol/L (ref 135–145)

## 2020-07-07 LAB — D-DIMER, QUANTITATIVE: D-Dimer, Quant: 3.04 ug/mL-FEU — ABNORMAL HIGH (ref 0.00–0.50)

## 2020-07-07 LAB — BRAIN NATRIURETIC PEPTIDE: B Natriuretic Peptide: 52.3 pg/mL (ref 0.0–100.0)

## 2020-07-07 NOTE — Discharge Instructions (Addendum)
I want you to take a half dose of your amlodipine (5 mg instead of 10 mg) until you follow up with your primary care provider.

## 2020-07-07 NOTE — Telephone Encounter (Signed)
See comment

## 2020-07-07 NOTE — ED Triage Notes (Signed)
Pt reports seeing her PCP yesterday and was started on Lasix. Pt present today for same swelling in both feet . Pt does report some swelling has gone down.

## 2020-07-07 NOTE — ED Provider Notes (Addendum)
[facility]    CSN: [id number] Arrival date & time: 07/07/20  0830      History   Chief Complaint Chief Complaint  Patient presents with  . Foot Swelling    LT-RT    HPI Valerie Cowan is a 70 y.o. female.   HPI   Foot swelling: Patient reports that she saw her PCP yesterday for bilateral foot swelling.  She states that she was started on Lasix yesterday for this symptom and that her symptoms have improved some but not as much as she was expecting. She reports that her weight is the same between when she started on her medication and today. No calf pain, mild SOB, no chest pain. No history of CKD, recent severe anemia or CHF.   Past Medical History:  Diagnosis Date  . Abnormal stress test   . Anxiety   . Arthritis   . Depression   . Heart murmur   . Hypertension   . Obesity     Patient Active Problem List   Diagnosis Date Noted  . Hypertensive disorder 08/27/2018  . Abnormal stress test   . Abnormal ECG 12/02/2013  . BACK PAIN, LUMBAR 10/19/2008  . EDEMA 09/29/2008  . SHORTNESS OF BREATH 09/29/2008  . HYPERTENSION, BENIGN ESSENTIAL 04/19/2008  . VARICOSE VEINS, LOWER EXTREMITIES 04/19/2008  . Obesity 09/23/2007  . GERD 09/23/2007  . 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 ABDOMINAL HYSTERECTOMY     19  . LEFT HEART CATHETERIZATION WITH CORONARY ANGIOGRAM N/A 12/24/2013   Procedure: LEFT HEART CATHETERIZATION WITH CORONARY ANGIOGRAM;  Surgeon: Corky Crafts, MD;  Location: [facility] CATH LAB;  Service: Cardiovascular;  Laterality: N/A;    OB History   No obstetric history on file.      Home Medications    Prior to Admission medications   Medication Sig Start Date End Date Taking? Authorizing Provider  ALPRAZolam (XANAX PO) alprazolam    [provider]  amLODipine (NORVASC) 10 MG tablet Take 10 mg by mouth daily. 06/17/19   [provider]  clindamycin (CLEOCIN)  150 MG capsule Take 150 mg by mouth 2 (two) times daily. 04/19/15   [provider]  hydrochlorothiazide (HYDRODIURIL) 12.5 MG tablet hydrochlorothiazide 12.5 mg tablet  Take 1 tablet every day by oral route.    [provider]  hydrochlorothiazide (HYDRODIURIL) 25 MG tablet Take 25 mg by mouth daily.    [provider]  HYDROcodone-acetaminophen (NORCO) 10-325 MG per tablet Take 1 tablet by mouth every 8 (eight) hours as needed (pain).     [provider]  HYDROcodone-homatropine (HYCODAN) 5-1.5 MG/5ML syrup Take 5 mLs by mouth 3 (three) times daily as needed for cough.  04/19/15   [provider]  lisinopril (PRINIVIL,ZESTRIL) 40 MG tablet Take 1 tablet (40 mg total) by mouth daily. 04/20/16   Linwood Dibbles, MD  meloxicam (MOBIC) 15 MG tablet meloxicam 15 mg tablet  Take 1 tablet every day by oral route as needed.    [provider]  rosuvastatin (CRESTOR) 5 MG tablet Take 5 mg by mouth daily. 07/06/19   [provider]  sulfamethoxazole-trimethoprim (BACTRIM DS,SEPTRA DS) 800-160 MG tablet Take 1 tablet by mouth 2 (two) times daily.    [provider]    Family History Family History  Problem Relation Age of Onset  . Cancer Mother   . Cancer Father   . Heart murmur Son   .  Colon cancer Sister     Social History Social History   Tobacco Use  . Smoking status: Never Smoker  Substance Use Topics  . Alcohol use: No     Allergies   Patient has no known allergies.   Review of Systems Review of Systems  As stated above in HPI Physical Exam Triage Vital Signs ED Triage Vitals  Enc Vitals Group     BP 07/07/20 0916 119/65     Pulse Rate 07/07/20 0916 72     Resp 07/07/20 0916 20     Temp 07/07/20 0916 98.8 F (37.1 C)     Temp Source 07/07/20 0916 Oral     SpO2 07/07/20 0916 97 %     Weight --      Height --      Head Circumference --      Peak Flow --      Pain Score 07/07/20 0913 0     Pain Loc --       Pain Edu? --      Excl. in GC? --    No data found.  Updated Vital Signs BP 119/65   Pulse 72   Temp 98.8 F (37.1 C) (Oral)   Resp 20   SpO2 97%   Physical Exam Vitals and nursing note reviewed.  Constitutional:      General: She is not in acute distress.    Appearance: Normal appearance. She is not ill-appearing, toxic-appearing or diaphoretic.  HENT:     Head: Normocephalic and atraumatic.  Eyes:     Comments: No pallor or jaundice   Cardiovascular:     Rate and Rhythm: Normal rate and regular rhythm.     Pulses: Normal pulses.     Heart sounds: Normal heart sounds.  Pulmonary:     Effort: Pulmonary effort is normal.     Breath sounds: Normal breath sounds.  Abdominal:     General: Bowel sounds are normal.     Palpations: Abdomen is soft.  Musculoskeletal:     Right lower leg: Edema (1+) present.     Left lower leg: Edema (1+) present.  Skin:    General: Skin is warm.     Capillary Refill: Capillary refill takes less than 2 seconds.     Coloration: Skin is not jaundiced or pale.     Findings: No erythema or rash.  Neurological:     Mental Status: She is alert and oriented to person, place, and time.      UC Treatments / Results  Labs (all labs ordered are listed, but only abnormal results are displayed) Labs Reviewed - No data to display  EKG   Radiology No results found.  Procedures Procedures (including critical care time)  Medications Ordered in UC Medications - No data to display  Initial Impression / Assessment and Plan / UC Course  I have reviewed the triage vital signs and the nursing notes.  Pertinent labs & imaging results that were available during my care of the patient were reviewed by me and considered in my medical decision making (see chart for details).     New.  Wide differential.  At this time I am going to obtain a chest x-ray, EKG and labs (BMP, BNP, D-Dimer, CBC).  In the meantime I am going to have her cut her amlodipine  dose in half as this likely is a side effect of her amlodipine dose however I do not want to miss any other potential diagnoses.  Discussed red flag signs and symptoms.  She will limit her sodium intake, track her weight and follow up with her PCP early next week. Pt agreeable with plan.   UPDATE: Her EKG shows 1st degree AV block which is not likely to be the cause of her symptoms and she has mild cardiomegaly on her chest x ray- discussed and she will need to follow up with her PCP or cardiologist to discuss further plan.  Final Clinical Impressions(s) / UC Diagnoses   Final diagnoses:  None   Discharge Instructions   None    ED Prescriptions    None     PDMP not reviewed this encounter.   Rushie Chestnut, PA-C 07/07/20 1025    [address], [place] 07/07/20 1118

## 2020-07-10 ENCOUNTER — Observation Stay (HOSPITAL_COMMUNITY)
Admission: EM | Admit: 2020-07-10 | Discharge: 2020-07-11 | Disposition: A | Discharge status: Home / Self Care | Payer: Medicare HMO | Attending: Family Medicine | Admitting: Family Medicine

## 2020-07-10 ENCOUNTER — Telehealth (HOSPITAL_COMMUNITY): Payer: Self-pay

## 2020-07-10 ENCOUNTER — Emergency Department (HOSPITAL_BASED_OUTPATIENT_CLINIC_OR_DEPARTMENT_OTHER): Payer: Medicare HMO

## 2020-07-10 ENCOUNTER — Encounter (HOSPITAL_COMMUNITY): Payer: Self-pay | Admitting: Emergency Medicine

## 2020-07-10 ENCOUNTER — Emergency Department (HOSPITAL_COMMUNITY): Payer: Medicare HMO

## 2020-07-10 ENCOUNTER — Other Ambulatory Visit: Payer: Self-pay

## 2020-07-10 DIAGNOSIS — E876 Hypokalemia: Secondary | ICD-10-CM

## 2020-07-10 DIAGNOSIS — R609 Edema, unspecified: Secondary | ICD-10-CM | POA: Diagnosis not present

## 2020-07-10 DIAGNOSIS — Z79899 Other long term (current) drug therapy: Secondary | ICD-10-CM | POA: Insufficient documentation

## 2020-07-10 DIAGNOSIS — M7989 Other specified soft tissue disorders: Secondary | ICD-10-CM | POA: Diagnosis not present

## 2020-07-10 DIAGNOSIS — I2699 Other pulmonary embolism without acute cor pulmonale: Principal | ICD-10-CM | POA: Diagnosis present

## 2020-07-10 DIAGNOSIS — N179 Acute kidney failure, unspecified: Secondary | ICD-10-CM | POA: Insufficient documentation

## 2020-07-10 DIAGNOSIS — R7989 Other specified abnormal findings of blood chemistry: Secondary | ICD-10-CM

## 2020-07-10 DIAGNOSIS — I10 Essential (primary) hypertension: Secondary | ICD-10-CM | POA: Diagnosis not present

## 2020-07-10 DIAGNOSIS — Z20822 Contact with and (suspected) exposure to covid-19: Secondary | ICD-10-CM | POA: Diagnosis not present

## 2020-07-10 DIAGNOSIS — I272 Pulmonary hypertension, unspecified: Secondary | ICD-10-CM

## 2020-07-10 DIAGNOSIS — R791 Abnormal coagulation profile: Secondary | ICD-10-CM | POA: Diagnosis present

## 2020-07-10 LAB — CBC WITH DIFFERENTIAL/PLATELET
Abs Immature Granulocytes: 0.01 10*3/uL (ref 0.00–0.07)
Basophils Absolute: 0 10*3/uL (ref 0.0–0.1)
Basophils Relative: 0 %
Eosinophils Absolute: 0.1 10*3/uL (ref 0.0–0.5)
Eosinophils Relative: 1 %
HCT: 43.9 % (ref 36.0–46.0)
Hemoglobin: 14.2 g/dL (ref 12.0–15.0)
Immature Granulocytes: 0 %
Lymphocytes Relative: 46 %
Lymphs Abs: 2.9 10*3/uL (ref 0.7–4.0)
MCH: 29.4 pg (ref 26.0–34.0)
MCHC: 32.3 g/dL (ref 30.0–36.0)
MCV: 90.9 fL (ref 80.0–100.0)
Monocytes Absolute: 0.8 10*3/uL (ref 0.1–1.0)
Monocytes Relative: 12 %
Neutro Abs: 2.7 10*3/uL (ref 1.7–7.7)
Neutrophils Relative %: 41 %
Platelets: 235 10*3/uL (ref 150–400)
RBC: 4.83 MIL/uL (ref 3.87–5.11)
RDW: 14.5 % (ref 11.5–15.5)
WBC: 6.4 10*3/uL (ref 4.0–10.5)
nRBC: 0 % (ref 0.0–0.2)

## 2020-07-10 LAB — BASIC METABOLIC PANEL
Anion gap: 9 (ref 5–15)
BUN: 12 mg/dL (ref 8–23)
CO2: 31 mmol/L (ref 22–32)
Calcium: 9.2 mg/dL (ref 8.9–10.3)
Chloride: 101 mmol/L (ref 98–111)
Creatinine, Ser: 1.06 mg/dL — ABNORMAL HIGH (ref 0.44–1.00)
GFR, Estimated: 57 mL/min — ABNORMAL LOW (ref >60)
Glucose, Bld: 93 mg/dL (ref 70–99)
Potassium: 3.6 mmol/L (ref 3.5–5.1)
Sodium: 141 mmol/L (ref 135–145)

## 2020-07-10 LAB — D-DIMER, QUANTITATIVE: D-Dimer, Quant: 2.91 ug/mL-FEU — ABNORMAL HIGH (ref 0.00–0.50)

## 2020-07-10 LAB — BRAIN NATRIURETIC PEPTIDE: B Natriuretic Peptide: 20.5 pg/mL (ref 0.0–100.0)

## 2020-07-10 MED ORDER — ROSUVASTATIN CALCIUM 5 MG PO TABS
5.0000 mg | ORAL_TABLET | Freq: Every day | ORAL | Status: DC
  Administered 2020-07-11: 5 mg via ORAL
  Filled 2020-07-10: qty 1

## 2020-07-10 MED ORDER — HEPARIN (PORCINE) 25000 UT/250ML-% IV SOLN
1900.0000 [IU]/h | INTRAVENOUS | Status: DC
  Administered 2020-07-10 – 2020-07-11 (×2): 1900 [IU]/h via INTRAVENOUS
  Filled 2020-07-10 (×2): qty 250

## 2020-07-10 MED ORDER — ACETAMINOPHEN 325 MG PO TABS: 650.0000 mg | ORAL_TABLET | Freq: Four times a day (QID) | ORAL | Status: DC | PRN

## 2020-07-10 MED ORDER — HEPARIN BOLUS VIA INFUSION
5000.0000 [IU] | Freq: Once | INTRAVENOUS | Status: AC
  Administered 2020-07-10: 5000 [IU] via INTRAVENOUS
  Filled 2020-07-10: qty 5000

## 2020-07-10 MED ORDER — IOHEXOL 350 MG/ML SOLN
50.0000 mL | Freq: Once | INTRAVENOUS | Status: AC | PRN
  Administered 2020-07-10: 50 mL via INTRAVENOUS

## 2020-07-10 NOTE — Telephone Encounter (Signed)
Pt returned call about blood work and is going to the emergency room.

## 2020-07-10 NOTE — ED Provider Notes (Signed)
[facility] Provider Note   CSN: [id number] Arrival date & time: 07/10/20  1330     History No chief complaint on file.   Miquel DunnJacqueline H Brunkhorst is a 70 y.o. female.  HPI  70 year old female presents today due to an abnormal D-dimer level.  Patient states that she had some swelling of her legs last week.  She was seen by her primary care doctor on Thursday and started on Lasix.  She continued to feel that her legs were swollen on Friday and went to an urgent care.  They drew a D-dimer.  It was elevated at 3 AM.  They contacted her she did not receive the message until today.  She presents here today due to the elevated D-dimer level.  She denies any current dyspnea, lateralized leg swelling and states that some of her swelling is improved with the Lasix.  She denies any DVT risk factors such as immobilization, surgery, history of DVT, or history of PE or other clots.     Past Medical History:  Diagnosis Date  . Abnormal stress test   . Anxiety   . Arthritis   . Depression   . Heart murmur   . Hypertension   . Obesity     Patient Active Problem List   Diagnosis Date Noted  . Hypertensive disorder 08/27/2018  . Abnormal stress test   . Abnormal ECG 12/02/2013  . BACK PAIN, LUMBAR 10/19/2008  . EDEMA 09/29/2008  . SHORTNESS OF BREATH 09/29/2008  . HYPERTENSION, BENIGN ESSENTIAL 04/19/2008  . VARICOSE VEINS, LOWER EXTREMITIES 04/19/2008  . Obesity 09/23/2007  . GERD 09/23/2007  . 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 ABDOMINAL HYSTERECTOMY     19  . LEFT HEART CATHETERIZATION WITH CORONARY ANGIOGRAM N/A 12/24/2013   Procedure: LEFT HEART CATHETERIZATION WITH CORONARY ANGIOGRAM;  Surgeon: Corky CraftsJayadeep S Varanasi, MD;  [facility];  Service: Cardiovascular;  Laterality: N/A;     OB History   No obstetric history on file.     Family History  Problem Relation Age of  Onset  . Cancer Mother   . Cancer Father   . Heart murmur Son   . Colon cancer Sister     Social History   Tobacco Use  . Smoking status: Never Smoker  Substance Use Topics  . Alcohol use: No    Home Medications Prior to Admission medications   Medication Sig Start Date End Date Taking? Authorizing Provider  ALPRAZolam (XANAX PO) alprazolam    [provider]  amLODipine (NORVASC) 10 MG tablet Take 10 mg by mouth daily. 06/17/19   [provider]  clindamycin (CLEOCIN) 150 MG capsule Take 150 mg by mouth 2 (two) times daily. 04/19/15   [provider]  hydrochlorothiazide (HYDRODIURIL) 12.5 MG tablet hydrochlorothiazide 12.5 mg tablet  Take 1 tablet every day by oral route.    [provider]  hydrochlorothiazide (HYDRODIURIL) 25 MG tablet Take 25 mg by mouth daily.    [provider]  HYDROcodone-acetaminophen (NORCO) 10-325 MG per tablet Take 1 tablet by mouth every 8 (eight) hours as needed (pain).     [provider]  HYDROcodone-homatropine (HYCODAN) 5-1.5 MG/5ML syrup Take 5 mLs by mouth 3 (three) times daily as needed for cough.  04/19/15   [provider]  lisinopril (PRINIVIL,ZESTRIL) 40 MG tablet Take 1 tablet (40 mg total) by mouth daily. 04/20/16   Linwood DibblesKnapp, Jon, MD  meloxicam (MOBIC) 15 MG tablet meloxicam 15 mg tablet  Take 1 tablet every day by oral route as needed.    [provider]  rosuvastatin (CRESTOR) 5 MG tablet Take 5 mg by mouth daily. 07/06/19   [provider]  sulfamethoxazole-trimethoprim (BACTRIM DS,SEPTRA DS) 800-160 MG tablet Take 1 tablet by mouth 2 (two) times daily.    [provider]    Allergies    Patient has no known allergies.  Review of Systems   Review of Systems  All other systems reviewed and are negative.   Physical Exam Updated Vital Signs BP 104/85   Pulse (!) 50   Temp 99.1 F (37.3 C) (Oral)   Resp 17   SpO2 99%   Physical Exam Vitals and  nursing note reviewed.  Constitutional:      General: She is not in acute distress.    Appearance: Normal appearance. She is obese. She is not ill-appearing.  HENT:     Head: Normocephalic.     Right Ear: External ear normal.     Left Ear: External ear normal.     Nose: Nose normal.     Mouth/Throat:     Mouth: Mucous membranes are moist.     Pharynx: Oropharynx is clear.  Eyes:     Extraocular Movements: Extraocular movements intact.     Pupils: Pupils are equal, round, and reactive to light.  Cardiovascular:     Rate and Rhythm: Normal rate and regular rhythm.     Pulses: Normal pulses.     Heart sounds: Normal heart sounds.  Pulmonary:     Effort: Pulmonary effort is normal.     Breath sounds: Normal breath sounds.  Abdominal:     General: Bowel sounds are normal. There is no distension.     Palpations: Abdomen is soft. There is no mass.  Musculoskeletal:        General: No swelling or tenderness. Normal range of motion.     Cervical back: Normal range of motion.     Right lower leg: No edema.     Left lower leg: No edema.  Skin:    General: Skin is warm.     Capillary Refill: Capillary refill takes less than 2 seconds.  Neurological:     General: No focal deficit present.     Mental Status: She is alert.     Cranial Nerves: No cranial nerve deficit.     Motor: No weakness.     Gait: Gait normal.  Psychiatric:        Mood and Affect: Mood normal.     ED Results / Procedures / Treatments   Labs (all labs ordered are listed, but only abnormal results are displayed) Labs Reviewed  BASIC METABOLIC PANEL - Abnormal; Notable for the following components:      Result Value   Creatinine, Ser 1.06 (*)    GFR, Estimated 57 (*)    All other components within normal limits  D-DIMER, QUANTITATIVE - Abnormal; Notable for the following components:   D-Dimer, Quant 2.91 (*)    All other components within normal limits  CBC WITH DIFFERENTIAL/PLATELET  BRAIN NATRIURETIC  PEPTIDE    EKG None  Radiology DG Chest 2 View  Result Date: 07/10/2020 CLINICAL DATA:  Leg swelling.  Elevated D-dimer. EXAM: CHEST - 2 VIEW COMPARISON:  07/07/2020 FINDINGS: The cardiac silhouette remains mildly enlarged. The lungs are well inflated and clear. No pleural effusion or pneumothorax is identified. No acute osseous  abnormality is seen. IMPRESSION: No active cardiopulmonary disease. Electronically Signed   By: Sebastian Ache M.D.   On: 07/10/2020 14:56   VAS [place] LOWER EXTREMITY VENOUS (DVT) (ONLY [facility] & [facility])  Result Date: 07/10/2020  Lower Venous DVT Study Patient Name:  QUINA WILBOURNE  Date of Exam:   07/10/2020 Medical Rec #: [id number]             Accession #:    [id number] Date of Birth: [birth year]              Patient Gender: F Patient Age:   34Y Exam Location:  [facility] Procedure:      VAS [place] LOWER EXTREMITY VENOUS (DVT) Referring Phys: [id number] Gee Habig --------------------------------------------------------------------------------  Indications: Swelling, and SOB.  Limitations: Poor ultrasound/tissue interface. Comparison Study: No prior studies. Performing Technologist: Jean Rosenthal RDMS,RVT  Examination Guidelines: A complete evaluation includes B-mode imaging, spectral Doppler, color Doppler, and power Doppler as needed of all accessible portions of each vessel. Bilateral testing is considered an integral part of a complete examination. Limited examinations for reoccurring indications may be performed as noted. The reflux portion of the exam is performed with the patient in reverse Trendelenburg.  +---------+---------------+---------+-----------+----------+-------------------+ RIGHT    CompressibilityPhasicitySpontaneityPropertiesThrombus Aging      +---------+---------------+---------+-----------+----------+-------------------+ CFV      Full           Yes      Yes                                       +---------+---------------+---------+-----------+----------+-------------------+ SFJ      Full                                                             +---------+---------------+---------+-----------+----------+-------------------+ FV Prox  Full                                                             +---------+---------------+---------+-----------+----------+-------------------+ FV Mid   Full                                                             +---------+---------------+---------+-----------+----------+-------------------+ FV DistalFull                                                             +---------+---------------+---------+-----------+----------+-------------------+ PFV      Full                                                             +---------+---------------+---------+-----------+----------+-------------------+  POP      Full           Yes      Yes                                      +---------+---------------+---------+-----------+----------+-------------------+ PTV      Full                                                             +---------+---------------+---------+-----------+----------+-------------------+ PERO                                                  Not well visualized +---------+---------------+---------+-----------+----------+-------------------+ Gastroc  Full                                                             +---------+---------------+---------+-----------+----------+-------------------+   +---------+---------------+---------+-----------+----------+-------------------+ LEFT     CompressibilityPhasicitySpontaneityPropertiesThrombus Aging      +---------+---------------+---------+-----------+----------+-------------------+ CFV      Full           Yes      Yes                                      +---------+---------------+---------+-----------+----------+-------------------+  SFJ      Full                                                             +---------+---------------+---------+-----------+----------+-------------------+ FV Prox  Full                                                             +---------+---------------+---------+-----------+----------+-------------------+ FV Mid   Full                                                             +---------+---------------+---------+-----------+----------+-------------------+ FV DistalFull                                                             +---------+---------------+---------+-----------+----------+-------------------+ PFV      Full                                                             +---------+---------------+---------+-----------+----------+-------------------+  POP      Full           Yes      Yes                                      +---------+---------------+---------+-----------+----------+-------------------+ PTV      Full                                                             +---------+---------------+---------+-----------+----------+-------------------+ PERO                                                  Not well visualized +---------+---------------+---------+-----------+----------+-------------------+ Gastroc  Full                                                             +---------+---------------+---------+-----------+----------+-------------------+    Summary: RIGHT: - There is no evidence of deep vein thrombosis in the lower extremity. However, portions of this examination were limited- see technologist comments above.  - No cystic structure found in the popliteal fossa.  LEFT: - There is no evidence of deep vein thrombosis in the lower extremity. However, portions of this examination were limited- see technologist comments above.  - No cystic structure found in the popliteal fossa.  *See table(s) above for measurements and  observations.    Preliminary     Procedures Procedures   Medications Ordered in ED Medications  iohexol (OMNIPAQUE) 350 MG/ML injection 50 mL (50 mLs Intravenous Contrast Given 07/10/20 1629)    ED Course  I have reviewed the triage vital signs and the nursing notes.  Pertinent labs & imaging results that were available during my care of the patient were reviewed by me and considered in my medical decision making (see chart for details).    MDM Rules/Calculators/A&P                          70 year old female presents today complaining of elevated D-dimer.  She has had bilateral lower extremity swelling but does not appear to be in any respiratory distress.  In fact her bilateral lower extremity swelling has improved with Lasix at home.  Here she has had bilateral lower extremity Dopplers which did not show any evidence of blood clot.  She has a CT angio of the chest pending.  This is negative she will be stable for discharge home.  Received call from Dr. Pia Mau that patient has multiple pulmonary emboli's. Heparinized per consult with pharmacy Discussed with patient and with family practice teaching service for admission Final Clinical Impression(s) / ED Diagnoses Final diagnoses:  None    Rx / DC Orders ED Discharge Orders    None       Margarita Grizzle, MD 07/10/20 2306

## 2020-07-10 NOTE — Progress Notes (Signed)
I have examined this patient and discussed plan briefly with the day team. I  will discuss management plan night team as well and cosign note once it is completed.

## 2020-07-10 NOTE — Progress Notes (Signed)
Lower extremity venous study completed.  Preliminary results relayed to Alycia Rossetti, RN.    See CV Proc for preliminary results report.   Jean Rosenthal, RDMS, RVT

## 2020-07-10 NOTE — H&P (Addendum)
Family Medicine Teaching [facility] Admission History and Physical Service Pager: [phone]  Patient name: Valerie Cowan Medical record number: [id number] Date of birth: 1950/05/01 Age: 70 y.o. Gender: female  Primary Care Provider: Quitman Livings, MD Consultants: none Code Status: full Preferred Emergency Contact: son (rodereick) [phone]  Chief Complaint: abnormal labs, sent by PCP  Assessment and Plan: Valerie Cowan is a 70 y.o. female presenting with symptomatic PE. PMH is significant for HTN, arthritis, GERD, HLD.    Pulmonary Emboli  Presented with elevated D-dimer at 2.91. On presentation patient appearing well, without dyspnea or hypoxia, though does endorse feeling SOB for the past week. CTA showing multiple small bilateral pulmonary emboli greatest in right lower lobe. Diffuse enlargement of cardiac chambers. Enlargement of central pulmonary arteries. Minimal subsegmental atelectasis in left lower lobe. DVT US of LEs negative. CXR negative. Will obtain echo due to concern for pulmonary hypertension.  Exam negative for pulmonary abnormalities and no LE swelling/pain. - admit to FPTS med-tele, attending Dr. Lum Babe  - vitals per floor - Heparin per pharmacy - Transition to DOAC tomorrow - am BMP, CBC - f/u echo  - PT/OT  HTN BP on admission: 127/65.  Home medication: Norvasc 10mg , hydrochlorothiazide 12.5 per chart, though patient states she is not currently taking - hold home meds   Arthritis  Home medication Norco 10-325 1 tablet q 8 hrs prn, meloxicam 15mg  daily prn  - Tylenol 650mg  q6 prn   Hyperlipidemia Home medication: Crestor 5mg  Last LDL 116 on 05/09/10 - consider repeat lipid panel   FEN/GI: Regular diet  Prophylaxis: Heparin   Disposition: Med-tele   History of Present Illness:  Valerie Cowan is a 70 y.o. female presenting with elevated D-dimer  Patient presented to her PCP on Thursday 5/19 for leg swelling, and went to urgent  care the following day to to increased leg swelling and they drew a D-dimer and she just got the message today so came to the ED.   Endorses bilateral leg pain associated with tenderness in the past week. The swelling has improved significantly since starting lasix. Patient states she is feeling fine today and wanting to go home. Denies any dyspnea. No recent surgery, long travel, history of DVT or PEF. For the past month she has been sitting in a high chair with her legs dangling but denies sitting for prolonged periods of time. She gets up and walks frequently. No recent travelling. Felt SOB for a couple days starting about 5/18 which spurred her PCP visit. Denies palpitations, chest pain, weakness. Resolved with rest. Endorses good urine output with the lasix without dysuria. She was recently taken off of amlodipine and HCTZ. Was started on lisinopril recently as well as vitamin D and lasix. She states that she takes adderral for htn.  Denies tobacco use, recreational drug use, and very rarely will have a glass of wine.  Denies personal or family history of clotting or bleeding disorders. Denies smoking or h/o of estrogen therapy.  Review Of Systems: Per HPI with the following additions:   Review of Systems  Constitutional: Negative for fever.  HENT: Positive for congestion.   Respiratory: Positive for shortness of breath. Negative for cough.   Cardiovascular: Negative.  Negative for chest pain, palpitations and leg swelling.       Endorses previous leg swelling but not currently   Gastrointestinal: Negative for abdominal pain, constipation, diarrhea and nausea.  Neurological: Negative for dizziness, light-headedness and headaches.     Patient Active  Problem List   Diagnosis Date Noted  . Pulmonary embolism (HCC) 07/10/2020  . Hypertensive disorder 08/27/2018  . Abnormal stress test   . Abnormal ECG 12/02/2013  . BACK PAIN, LUMBAR 10/19/2008  . EDEMA 09/29/2008  . SHORTNESS OF BREATH  09/29/2008  . HYPERTENSION, BENIGN ESSENTIAL 04/19/2008  . VARICOSE VEINS, LOWER EXTREMITIES 04/19/2008  . Obesity 09/23/2007  . GERD 09/23/2007  . 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 Abnormal stress test   . Anxiety   . Arthritis   . Depression   . Heart murmur   . Hypertension   . Obesity     Past Surgical History: Past Surgical History:  Procedure Laterality Date  . ABDOMINAL HYSTERECTOMY     19  . LEFT HEART CATHETERIZATION WITH CORONARY ANGIOGRAM N/A 12/24/2013   Procedure: LEFT HEART CATHETERIZATION WITH CORONARY ANGIOGRAM;  Surgeon: Corky Crafts, MD;  Location: [facility] CATH LAB;  Service: Cardiovascular;  Laterality: N/A;    Social History: Social History   Tobacco Use  . Smoking status: Never Smoker  Substance Use Topics  . Alcohol use: No    Family History: Family History  Problem Relation Age of Onset  . Cancer Mother   . Cancer Father   . Heart murmur Son   . Colon cancer Sister     Allergies and Medications: No Known Allergies No current facility-administered medications on file prior to encounter.   Current Outpatient Medications on File Prior to Encounter  Medication Sig Dispense Refill  . ALPRAZolam (XANAX PO) alprazolam    . amLODipine (NORVASC) 10 MG tablet Take 10 mg by mouth daily.    . clindamycin (CLEOCIN) 150 MG capsule Take 150 mg by mouth 2 (two) times daily.  0  . hydrochlorothiazide (HYDRODIURIL) 12.5 MG tablet hydrochlorothiazide 12.5 mg tablet  Take 1 tablet every day by oral route.    . hydrochlorothiazide (HYDRODIURIL) 25 MG tablet Take 25 mg by mouth daily.    Marland Kitchen HYDROcodone-acetaminophen (NORCO) 10-325 MG per tablet Take 1 tablet by mouth every 8 (eight) hours as needed (pain).     Marland Kitchen HYDROcodone-homatropine (HYCODAN) 5-1.5 MG/5ML syrup Take 5 mLs by mouth 3 (three) times daily as needed for cough.   0  . lisinopril (PRINIVIL,ZESTRIL) 40 MG tablet Take 1  tablet (40 mg total) by mouth daily. 30 tablet 0  . meloxicam (MOBIC) 15 MG tablet meloxicam 15 mg tablet  Take 1 tablet every day by oral route as needed.    . rosuvastatin (CRESTOR) 5 MG tablet Take 5 mg by mouth daily.    Marland Kitchen sulfamethoxazole-trimethoprim (BACTRIM DS,SEPTRA DS) 800-160 MG tablet Take 1 tablet by mouth 2 (two) times daily.      Objective: BP 127/64   Pulse 70   Temp 99.1 F (37.3 C) (Oral)   Resp 15   SpO2 97%   Physical Exam Constitutional:      General: She is not in acute distress.    Appearance: Normal appearance. She is obese. She is not ill-appearing.  HENT:     Head: Normocephalic and atraumatic.  Eyes:     Extraocular Movements: Extraocular movements intact.     Conjunctiva/sclera: Conjunctivae normal.  Cardiovascular:     Rate and Rhythm: Normal rate and regular rhythm.     Pulses: Normal pulses.     Heart sounds: Normal heart sounds.  Pulmonary:     Effort: Pulmonary effort is normal.  Breath sounds: Normal breath sounds.  Abdominal:     General: There is no distension.     Palpations: Abdomen is soft.     Tenderness: There is no abdominal tenderness.  Musculoskeletal:        General: Normal range of motion.     Cervical back: Normal range of motion and neck supple.  Skin:    General: Skin is warm and dry.  Neurological:     General: No focal deficit present.     Mental Status: She is alert.  Psychiatric:        Mood and Affect: Mood normal.        Behavior: Behavior normal.     Labs and Imaging: CBC BMET  Recent Labs  Lab 07/10/20 1520  WBC 6.4  HGB 14.2  HCT 43.9  PLT 235   Recent Labs  Lab 07/10/20 1520  NA 141  K 3.6  CL 101  CO2 31  BUN 12  CREATININE 1.06*  GLUCOSE 93  CALCIUM 9.2     EKG: NSR, rate 70bpm   DG Chest 2 View  Result Date: 07/10/2020 CLINICAL DATA:  Leg swelling.  Elevated D-dimer. EXAM: CHEST - 2 VIEW COMPARISON:  07/07/2020 FINDINGS: The cardiac silhouette remains mildly enlarged. The lungs  are well inflated and clear. No pleural effusion or pneumothorax is identified. No acute osseous abnormality is seen. IMPRESSION: No active cardiopulmonary disease. Electronically Signed   By: Sebastian Ache M.D.   On: 07/10/2020 14:56   CT Angio Chest PE W/Cm &/Or Wo Cm  Result Date: 07/10/2020 CLINICAL DATA:  Elevated D-dimer question pulmonary embolism, history hypertension EXAM: CT ANGIOGRAPHY CHEST WITH CONTRAST TECHNIQUE: Multidetector CT imaging of the chest was performed using the standard protocol during bolus administration of intravenous contrast. Multiplanar CT image reconstructions and MIPs were obtained to evaluate the vascular anatomy. CONTRAST:  60mL OMNIPAQUE IOHEXOL 350 MG/ML SOLN IV COMPARISON:  CT chest 09/05/2013 FINDINGS: Cardiovascular: Aorta normal caliber without aneurysm or dissection. Minimal atherosclerotic calcification aorta. Pulmonary arteries adequately opacified. Filling defects identified in RIGHT middle and RIGHT lower lobe pulmonary arteries consistent with pulmonary emboli. Additional small emboli in LEFT upper lobe, RIGHT upper lobe, and LEFT lower lobe. Enlargement of central pulmonary arteries. Views enlargement of cardiac chambers. No pericardial effusion. Mediastinum/Nodes: Esophagus unremarkable. Base of cervical region normal appearance. No thoracic adenopathy. Lungs/Pleura: Minimal subsegmental atelectasis LEFT lower lobe. Lungs otherwise clear. No segmental consolidation, pleural effusion or pneumothorax. Upper Abdomen: Visualized upper abdomen unremarkable Musculoskeletal: No acute osseous findings. Review of the MIP images confirms the above findings. IMPRESSION: Multiple small BILATERAL pulmonary emboli as above, greatest in RIGHT lower lobe. Diffuse enlargement of cardiac chambers. Enlargement of central pulmonary arteries question pulmonary arterial hypertension. Minimal subsegmental atelectasis LEFT lower lobe. Aortic Atherosclerosis (ICD10-I70.0). Critical  Value/emergent results were called by telephone at the time of interpretation on 07/10/2020 at 1658 hours to provider DR. Rosalia Hammers, Who verbally acknowledged these results. Electronically Signed   By: Ulyses Southward M.D.   On: 07/10/2020 16:58   VAS [place] LOWER EXTREMITY VENOUS (DVT) (ONLY [facility] & [facility])  Result Date: 07/10/2020  Lower Venous DVT Study Patient Name:  Valerie Cowan  Date of Exam:   07/10/2020 Medical Rec #: [id number]             Accession #:    [id number] Date of Birth: [birth year]              Patient Gender: F Patient Age:   14Y  Exam Location:  [facility] Procedure:      VAS [place] LOWER EXTREMITY VENOUS (DVT) Referring Phys: [id number] DANIELLE RAY --------------------------------------------------------------------------------  Indications: Swelling, and SOB.  Limitations: Poor ultrasound/tissue interface. Comparison Study: No prior studies. Performing Technologist: Jean Rosenthal RDMS,RVT  Examination Guidelines: A complete evaluation includes B-mode imaging, spectral Doppler, color Doppler, and power Doppler as needed of all accessible portions of each vessel. Bilateral testing is considered an integral part of a complete examination. Limited examinations for reoccurring indications may be performed as noted. The reflux portion of the exam is performed with the patient in reverse Trendelenburg.  +---------+---------------+---------+-----------+----------+-------------------+ RIGHT    CompressibilityPhasicitySpontaneityPropertiesThrombus Aging      +---------+---------------+---------+-----------+----------+-------------------+ CFV      Full           Yes      Yes                                      +---------+---------------+---------+-----------+----------+-------------------+ SFJ      Full                                                             +---------+---------------+---------+-----------+----------+-------------------+ FV Prox  Full                                                              +---------+---------------+---------+-----------+----------+-------------------+ FV Mid   Full                                                             +---------+---------------+---------+-----------+----------+-------------------+ FV DistalFull                                                             +---------+---------------+---------+-----------+----------+-------------------+ PFV      Full                                                             +---------+---------------+---------+-----------+----------+-------------------+ POP      Full           Yes      Yes                                      +---------+---------------+---------+-----------+----------+-------------------+ PTV      Full                                                             +---------+---------------+---------+-----------+----------+-------------------+  PERO                                                  Not well visualized +---------+---------------+---------+-----------+----------+-------------------+ Gastroc  Full                                                             +---------+---------------+---------+-----------+----------+-------------------+   +---------+---------------+---------+-----------+----------+-------------------+ LEFT     CompressibilityPhasicitySpontaneityPropertiesThrombus Aging      +---------+---------------+---------+-----------+----------+-------------------+ CFV      Full           Yes      Yes                                      +---------+---------------+---------+-----------+----------+-------------------+ SFJ      Full                                                             +---------+---------------+---------+-----------+----------+-------------------+ FV Prox  Full                                                              +---------+---------------+---------+-----------+----------+-------------------+ FV Mid   Full                                                             +---------+---------------+---------+-----------+----------+-------------------+ FV DistalFull                                                             +---------+---------------+---------+-----------+----------+-------------------+ PFV      Full                                                             +---------+---------------+---------+-----------+----------+-------------------+ POP      Full           Yes      Yes                                      +---------+---------------+---------+-----------+----------+-------------------+ PTV      Full                                                             +---------+---------------+---------+-----------+----------+-------------------+  PERO                                                  Not well visualized +---------+---------------+---------+-----------+----------+-------------------+ Gastroc  Full                                                             +---------+---------------+---------+-----------+----------+-------------------+     Summary: RIGHT: - There is no evidence of deep vein thrombosis in the lower extremity. However, portions of this examination were limited- see technologist comments above.  - No cystic structure found in the popliteal fossa.  LEFT: - There is no evidence of deep vein thrombosis in the lower extremity. However, portions of this examination were limited- see technologist comments above.  - No cystic structure found in the popliteal fossa.  *See table(s) above for measurements and observations. Electronically signed by Sherald Hesshristopher Clark MD on 07/10/2020 at 5:16:48 PM.    Final     Cora CollumPaige, Victoria J, DO 07/10/2020, 6:57 PM PGY-1, [facility] FPTS Intern pager: [phone], text pages welcome

## 2020-07-10 NOTE — H&P (Signed)
70 Y/O F with PMXObesity, HTN, and Depression presented with hx of SOB and B/L LL swelling, for which she was seen and evaluated at the [facility] 3 days ago. She later received a message to return to the ED for abnormally high D-Dimer. She stated that she has been taking Lasix as prescribed by her PCP, which helped with her leg swellings. She showed me the picture on her phone of her leg swelling. She feels well today with no concerns.  Exam: Gen: She is in no distress HEENT: EOMI, PERRLA, no distended neck vein Heart: S1 S2 normal, no murmur. RRR. Lungs: CTA Abd: Soft, ND/NT, BS+ and normal. Ext: No edema, no calf tenderness  A/P: Acute Pulmonary embolism: unknown trigger CTA chest showed Multiple small emboli, with the greatest in the right lower lobe and questionable pulmonary HTN. Duplex US of both LL were negative for DVT. D-dimer 3 to 2.9 Normal saturation on RA and BP in the low normal range. PESi score: Class II, Low Risk: 1.7-3.5% 30-day mortality in this group. Monitor her O2 requirement closely. Obtain ECHO to assess her LVEF and for Pulm HTN. Heparin drip started per pharmacy, plan to transition to DOAC in the AM Monitor vitals closely. She was bradycardiac on admission, but that has since improved.  Mild Acute Kidney Injury: Oral hydration as tolerated. Avoid nephrotoxic agents.  HTN: Review her home regimen and adjust as needed.

## 2020-07-10 NOTE — Plan of Care (Signed)

## 2020-07-10 NOTE — ED Provider Notes (Signed)
Emergency Medicine Provider Triage Evaluation Note  MORNINGSTAR TOFT , a 70 y.o. female  was evaluated in triage.  Pt complains of abnormal labwork. Has been having bilateral lower extremity edema for the past few days.  Was seen and evaluated 3 days ago at urgent care, lab work was collected including D-dimer and was elevated so she was sent here for CT scan.  Denies chest pain.  He does have some dyspnea on exertion but this is normal for her.  This has not worsened.  No hemoptysis.  No unilateral leg swelling.  Review of Systems  Positive: Bilateral lower extremity edema Negative: Chest pain, shortness of breath, not  Physical Exam  BP 127/65 (BP Location: Right Arm)   Pulse (!) 55   Temp 99.1 F (37.3 C) (Oral)   Resp 17   SpO2 100%  Gen:   Awake, no distress   Resp:  Normal effort  MSK:   Moves extremities without difficulty  Other:  1+ pitting edema in bilateral lower extremities  Medical Decision Making  Medically screening exam initiated at 2:40 PM.  Appropriate orders placed.  BETZABE BEVANS was informed that the remainder of the evaluation will be completed by another provider, this initial triage assessment does not replace that evaluation, and the importance of remaining in the ED until their evaluation is complete.  Will obtain lab work and imaging   Dietrich Pates, PA-C 07/10/20 1442    Rozelle Logan, DO 07/10/20 1615

## 2020-07-10 NOTE — Progress Notes (Signed)
ANTICOAGULATION CONSULT NOTE - Initial Consult  Pharmacy Consult for heparin Indication: pulmonary embolus  No Known Allergies  Patient Measurements:   Heparin Dosing Weight: 120 kg (patient reported)  Vital Signs: Temp: 99.1 F (37.3 C) (05/23 1336) Temp Source: Oral (05/23 1336) BP: 127/64 (05/23 1715) Pulse Rate: 70 (05/23 1715)  Labs: Recent Labs    07/10/20 1520  HGB 14.2  HCT 43.9  PLT 235  CREATININE 1.06*    CrCl cannot be calculated (Unknown ideal weight.).   Medical History: Past Medical History:  Diagnosis Date  . Abnormal stress test   . Anxiety   . Arthritis   . Depression   . Heart murmur   . Hypertension   . Obesity     Medications:  (Not in a hospital admission)   Assessment: 72 YOF with acute bilateral pulmonary emboli to start IV heparin. H/H and Plt wnl. SCr 1.06.   Goal of Therapy:  Heparin level 0.3-0.7 units/ml Monitor platelets by anticoagulation protocol: Yes   Plan:  -Heparin 5000 units IV bolus followed by heparin infusion at 1900 units/hr  -F/u 6 hr HL -Monitor daily HL, CBC and s/s of bleeding  Vinnie Level, PharmD., BCPS, BCCCP Clinical Pharmacist Please refer to [facility] for unit-specific pharmacist

## 2020-07-10 NOTE — Hospital Course (Addendum)
Valerie Cowan is a 70 y.o. female presenting with elevated d-dimer and admitted PE. PMH is significant for HTN, arthritis, GERD, HLD.     Unprovoked Pulmonary Embolism Presented with elevated D-dimer at 2.91. On presentation patient appearing well, without dyspnea or hypoxia, though does endorse feeling SOB for the past week. CTA showing multiple small bilateral pulmonary emboli greatest in right lower lobe. Diffuse enlargement of cardiac chambers. Enlargement of central pulmonary arteries. Minimal subsegmental atelectasis in left lower lobe. DVT US of LEs negative. CXR negative. Exam negative for pulmonary abnormalities and no LE swelling/pain.Received IV Heparin in hospital ad changed to PO Eliquis.Echo shows grade 1 diastolic dysfunction.  Hypokalemia K+ 3.2 this am, likely due to lasix. Replete with 40 meq BID  Mild AKI On presentation sCr 1.06, improved to 0.94 this morning, likely setting of dehydration

## 2020-07-10 NOTE — ED Triage Notes (Signed)
Pt here from home had a d dimer drawn 3 days ago a, sent over to r/o PE  Results 3.04

## 2020-07-10 NOTE — Progress Notes (Signed)
Attempt to call, to receive report for this pt, no answer on phone call

## 2020-07-11 ENCOUNTER — Observation Stay (HOSPITAL_BASED_OUTPATIENT_CLINIC_OR_DEPARTMENT_OTHER): Payer: Medicare HMO

## 2020-07-11 ENCOUNTER — Other Ambulatory Visit (HOSPITAL_COMMUNITY): Payer: Self-pay

## 2020-07-11 DIAGNOSIS — R0602 Shortness of breath: Secondary | ICD-10-CM

## 2020-07-11 DIAGNOSIS — E876 Hypokalemia: Secondary | ICD-10-CM

## 2020-07-11 DIAGNOSIS — I2699 Other pulmonary embolism without acute cor pulmonale: Secondary | ICD-10-CM | POA: Diagnosis not present

## 2020-07-11 DIAGNOSIS — R7989 Other specified abnormal findings of blood chemistry: Secondary | ICD-10-CM

## 2020-07-11 LAB — CBC
HCT: 39.4 % (ref 36.0–46.0)
Hemoglobin: 12.7 g/dL (ref 12.0–15.0)
MCH: 28.9 pg (ref 26.0–34.0)
MCHC: 32.2 g/dL (ref 30.0–36.0)
MCV: 89.5 fL (ref 80.0–100.0)
Platelets: 220 10*3/uL (ref 150–400)
RBC: 4.4 MIL/uL (ref 3.87–5.11)
RDW: 14.4 % (ref 11.5–15.5)
WBC: 6.4 10*3/uL (ref 4.0–10.5)
nRBC: 0 % (ref 0.0–0.2)

## 2020-07-11 LAB — ECHOCARDIOGRAM COMPLETE
AR max vel: 2.34 cm2
AV Area VTI: 2.26 cm2
AV Area mean vel: 2.39 cm2
AV Mean grad: 5 mmHg
AV Peak grad: 8.8 mmHg
Ao pk vel: 1.48 m/s
Area-P 1/2: 3.31 cm2
MV VTI: 1.19 cm2
S' Lateral: 3.5 cm

## 2020-07-11 LAB — BASIC METABOLIC PANEL
Anion gap: 8 (ref 5–15)
BUN: 10 mg/dL (ref 8–23)
CO2: 30 mmol/L (ref 22–32)
Calcium: 8.8 mg/dL — ABNORMAL LOW (ref 8.9–10.3)
Chloride: 101 mmol/L (ref 98–111)
Creatinine, Ser: 0.94 mg/dL (ref 0.44–1.00)
GFR, Estimated: >60 mL/min (ref >60)
Glucose, Bld: 106 mg/dL — ABNORMAL HIGH (ref 70–99)
Potassium: 3.2 mmol/L — ABNORMAL LOW (ref 3.5–5.1)
Sodium: 139 mmol/L (ref 135–145)

## 2020-07-11 LAB — SARS CORONAVIRUS 2 (TAT 6-24 HRS): SARS Coronavirus 2: NEGATIVE

## 2020-07-11 LAB — HEPARIN LEVEL (UNFRACTIONATED)
Heparin Unfractionated: 0.63 IU/mL (ref 0.30–0.70)
Heparin Unfractionated: 0.69 IU/mL (ref 0.30–0.70)

## 2020-07-11 LAB — HIV ANTIBODY (ROUTINE TESTING W REFLEX): HIV Screen 4th Generation wRfx: NONREACTIVE

## 2020-07-11 MED ORDER — APIXABAN 5 MG PO TABS
10.0000 mg | ORAL_TABLET | Freq: Two times a day (BID) | ORAL | Status: DC
  Administered 2020-07-11: 10 mg via ORAL
  Filled 2020-07-11: qty 2

## 2020-07-11 MED ORDER — PERFLUTREN LIPID MICROSPHERE
1.0000 mL | INTRAVENOUS | Status: AC | PRN
  Administered 2020-07-11: 8 mL via INTRAVENOUS
  Filled 2020-07-11: qty 10

## 2020-07-11 MED ORDER — APIXABAN 5 MG PO TABS: 5.0000 mg | ORAL_TABLET | Freq: Two times a day (BID) | ORAL | Status: DC

## 2020-07-11 MED ORDER — APIXABAN 5 MG PO TABS
5.0000 mg | ORAL_TABLET | Freq: Two times a day (BID) | ORAL | 0 refills | Status: AC
  Filled 2020-07-11 (×2): qty 40, 20d supply, fill #0

## 2020-07-11 MED ORDER — APIXABAN 5 MG PO TABS
10.0000 mg | ORAL_TABLET | Freq: Two times a day (BID) | ORAL | 0 refills | Status: AC
  Filled 2020-07-11: qty 68, 27d supply, fill #0
  Filled 2020-07-11: qty 68, 17d supply, fill #0

## 2020-07-11 MED ORDER — POTASSIUM CHLORIDE CRYS ER 20 MEQ PO TBCR
40.0000 meq | EXTENDED_RELEASE_TABLET | Freq: Two times a day (BID) | ORAL | Status: DC
  Administered 2020-07-11: 40 meq via ORAL
  Filled 2020-07-11: qty 2

## 2020-07-11 NOTE — Discharge Instructions (Signed)
Dear Ms. Mezquita,  You presented to hospital with clots in your lungs.  We started you on blood thinners and transitioned you to oral blood thinners.  -- Please take 10 mg Eliquis (Blood thinner) for 7 days starting tonight and 2 times daily starting tomorrow. -- Please take 5 mg twice daily after 7 days May 31st --Please make an appointment with your PCP within a week and they should be following you with these blood thinners.  It was pleasure taking care of you! Dr. Gerarda Fraction  Pulmonary Embolism  A pulmonary embolism (PE) is a sudden blockage or decrease of blood flow in one or both lungs that happens when a clot travels into the arteries of the lung (pulmonary arteries). Most blockages come from a blood clot that forms in the vein of a leg or arm (deep vein thrombosis, DVT) and travels to the lungs. A clot is blood that has thickened into a gel or solid. PE is a dangerous and life-threatening condition that needs to be treated right away. What are the causes? This condition is usually caused by a blood clot that forms in a vein and moves to the lungs. In rare cases, it may be caused by air, fat, part of a tumor, or other tissue that moves through the veins and into the lungs. What increases the risk? The following factors may make you more likely to develop this condition:  Experiencing a traumatic injury, such as breaking a hip or leg.  Having: ? A spinal cord injury. ? Major surgery, especially hip or knee replacement, or surgery on parts of the nervous system or on the abdomen. ? A stroke. ? A blood clotting disease. ? Long-term (chronic) lung or heart disease. ? Cancer, especially if you are being treated with chemotherapy. ? A central venous catheter.  Taking medicines that contain estrogen. These include birth control pills and hormone replacement therapy.  Being: ? Pregnant. ? In the period of time after your baby is delivered (postpartum). ? Older than age  52. ? Overweight. ? A smoker, especially if you have other risks. ? Not very active (sedentary), not being able to move at all, or spending long periods sitting, such as travel over 6 hours. You are also at a greater risk if you have a leg in a cast or splint. What are the signs or symptoms? Symptoms of this condition usually start suddenly and include:  Shortness of breath during activity or at rest.  Coughing, coughing up blood, or coughing up bloody mucus.  Chest pain, back pain, or shoulder blade pain that gets worse with deep breaths.  Rapid or irregular heartbeat.  Feeling light-headed or dizzy, or fainting.  Feeling anxious.  Pain and swelling in a leg. This is a symptom of DVT, which can lead to PE. How is this diagnosed? This condition may be diagnosed based on your medical history, a physical exam, and tests. Tests may include:  Blood tests.  An ECG (electrocardiogram) of the heart.  A CT pulmonary angiogram. This test checks blood flow in and around your lungs.  A ventilation-perfusion scan, also called a lung VQ scan. This test measures air flow and blood flow to the lungs.  An ultrasound to check for a DVT. How is this treated? Treatment for this condition depends on many factors, such as the cause of your PE, your risk for bleeding or developing more clots, and other medical conditions you may have. Treatment aims to stop blood clots from forming or  growing larger. In some cases, treatment may be aimed at breaking apart or removing the blood clot. Treatment may include:  Medicines, such as: ? Blood thinning medicines, also called anticoagulants, to stop clots from forming and growing. ? Medicines that break apart clots (thrombolytics).  Procedures, such as: ? Using a flexible tube to remove a blood clot (embolectomy) or to deliver medicine to destroy it (catheter-directed thrombolysis). ? Surgery to remove the clot (surgical embolectomy). This is rare. You may  need a combination of immediate, long-term, and extended treatments. Your treatment may continue for several months (maintenance therapy) or longer depending on your medical conditions. You and your health care provider will work together to choose the treatment program that is best for you. Follow these instructions at home: Medicines  Take over-the-counter and prescription medicines only as told by your health care provider.  If you are taking blood thinners: ? Talk with your health care provider before you take any medicines that contain aspirin or NSAIDs, such as ibuprofen. These medicines increase your risk for dangerous bleeding. ? Take your medicine exactly as told, at the same time every day. ? Avoid activities that could cause injury or bruising, and follow instructions about how to prevent falls. ? Wear a medical alert bracelet or carry a card that lists what medicines you take.  Understand what foods and drugs interact with any medicines that you are taking. General instructions  Ask your health care provider when you may return to your normal activities. Avoid sitting or lying for a long time without moving.  Maintain a healthy weight. Ask your health care provider what weight is healthy for you.  Do not use any products that contain nicotine or tobacco, such as cigarettes, e-cigarettes, and chewing tobacco. If you need help quitting, ask your health care provider.  Talk with your health care provider about any travel plans. It is important to make sure that you are still able to take your medicine while traveling.  Keep all follow-up visits as told by your health care provider. This is important. Where to find more information  American Lung Association: www.lung.org  Centers for Disease Control and Prevention: FootballExhibition.com.br Contact a health care provider if:  You missed a dose of your blood thinner medicine. Get help right away if you:  Have: ? New or increased pain,  swelling, warmth, or redness in an arm or leg. ? Shortness of breath that gets worse during activity or at rest. ? A fever. ? Worsening chest pain. ? A rapid or irregular heartbeat. ? A severe headache. ? Vision changes. ? A serious fall or accident, or you hit your head. ? Stomach pain. ? Blood in your vomit, stool, or urine. ? A cut that will not stop bleeding.  Cough up blood.  Feel light-headed or dizzy, and that feeling does not go away.  Cannot move your arms or legs.  Are confused or have memory loss. These symptoms may represent a serious problem that is an emergency. Do not wait to see if the symptoms will go away. Get medical help right away. Call your local emergency services (911 in the [place] Do not drive yourself to the hospital. Summary  A pulmonary embolism (PE) is a serious and potentially life-threatening condition, in which a blood clot from one part of the body (deep vein thrombosis, DVT) travels to the arteries of the lung, causing a sudden blockage or decrease of blood flow to the lungs. This may result in shortness of  breath, chest pain, dizziness, and fainting.  Treatments for this condition usually include medicines to thin your blood (anticoagulants) or medicines to break apart blood clots (thrombolytics).  If you are given blood thinners, take your medicine exactly as told by your health care provider, at the same time every day. This is important.  Understand what foods and drugs interact with any medicines that you are taking.  If you have signs of PE or DVT, call your local emergency services (911 in the [place] This information is not intended to replace advice given to you by your health care provider. Make sure you discuss any questions you have with your health care provider. Document Revised: 12/18/2018 Document Reviewed: 12/18/2018 Elsevier Patient Education  2021 [employer]           Information on my medicine - ELIQUIS  (apixaban)  Why was Eliquis prescribed for you? Eliquis was prescribed to treat blood clots that may have been found in the veins of your legs (deep vein thrombosis) or in your lungs (pulmonary embolism) and to reduce the risk of them occurring again.  What do You need to know about Eliquis ? The starting dose is 10 mg (two 5 mg tablets) taken TWICE daily for the FIRST SEVEN (7) DAYS, then on (enter date)  07/18/20  the dose is reduced to ONE 5 mg tablet taken TWICE daily.  Eliquis may be taken with or without food.   Try to take the dose about the same time in the morning and in the evening. If you have difficulty swallowing the tablet whole please discuss with your pharmacist how to take the medication safely.  Take Eliquis exactly as prescribed and DO NOT stop taking Eliquis without talking to the doctor who prescribed the medication.  Stopping may increase your risk of developing a new blood clot.  Refill your prescription before you run out.  After discharge, you should have regular check-up appointments with your healthcare provider that is prescribing your Eliquis.    What do you do if you miss a dose? If a dose of ELIQUIS is not taken at the scheduled time, take it as soon as possible on the same day and twice-daily administration should be resumed. The dose should not be doubled to make up for a missed dose.  Important Safety Information A possible side effect of Eliquis is bleeding. You should call your healthcare provider right away if you experience any of the following: ? Bleeding from an injury or your nose that does not stop. ? Unusual colored urine (red or dark brown) or unusual colored stools (red or black). ? Unusual bruising for unknown reasons. ? A serious fall or if you hit your head (even if there is no bleeding).  Some medicines may interact with Eliquis and might increase your risk of bleeding or clotting while on Eliquis. To help avoid this, consult your  healthcare provider or pharmacist prior to using any new prescription or non-prescription medications, including herbals, vitamins, non-steroidal anti-inflammatory drugs (NSAIDs) and supplements.  This website has more information on Eliquis (apixaban): http://www.eliquis.com/eliquis/home

## 2020-07-11 NOTE — Discharge Summary (Signed)
Family Medicine [facility] Discharge Summary  Patient name: Valerie Cowan Medical record number: [id number] Date of birth: 15-Apr-1950 Age: 70 y.o. Gender: female Date of Admission: 07/10/2020  Date of Discharge: 07/11/2020 Admitting Physician: No admitting provider for patient encounter.  Primary Care Provider: Quitman Livings, MD Consultants: None  Indication for Hospitalization: Unprovoked Pulmonary embolism  Discharge Diagnoses/Problem List:  1. Unprovoked pulmonary embolism 2. Hypokalemia 3. Mild AKI  Disposition: Home  Discharge Condition: Stable  Discharge Exam:  Temp:  [98.5 F (36.9 C)-99.1 F (37.3 C)] 98.8 F (37.1 C) (05/24 0431) Pulse Rate:  [48-71] 52 (05/24 0431) Resp:  [15-25] 18 (05/24 0431) BP: (96-141)/(53-92) 114/53 (05/24 0431) SpO2:  [95 %-100 %] 100 % (05/24 0431) Physical Exam: General: Well-developed, lying comfortably in bed, NAD Cardiovascular: Regular rate and rhythm, no murmurs no rubs no gallops Respiratory: Clear to auscultation bilaterally Abdomen: Soft, nondistended, nontender, bowel sounds present Extremities: 1+ pitting edema present  Brief Hospital Course:  Valerie Cowan is a 70 y.o. female presenting with elevated d-dimer and admitted PE. PMH is significant for HTN, arthritis, GERD, HLD.     Unprovoked Pulmonary Embolism Presented with elevated D-dimer at 2.91. On presentation patient appearing well, without dyspnea or hypoxia, though does endorse feeling SOB for the past week. CTA showing multiple small bilateral pulmonary emboli greatest in right lower lobe. Diffuse enlargement of cardiac chambers. Enlargement of central pulmonary arteries. Minimal subsegmental atelectasis in left lower lobe. DVT US of LEs negative. CXR negative. Exam negative for pulmonary abnormalities and no LE swelling/pain.Received IV Heparin in hospital ad changed to PO Eliquis.Echo shows grade 1 diastolic dysfunction.  Hypokalemia K+ 3.2  this am, likely due to lasix. Replete with 40 meq BID  Mild AKI On presentation sCr 1.06, improved to 0.94 this morning, likely setting of dehydration    Issues for Follow Up:  1. CBC and BMP in 1 week 2. D-dimer in 2 weeks  Significant Procedures: None  Significant Labs and Imaging:  Recent Labs  Lab 07/07/20 1018 07/10/20 1520 07/11/20 0056  WBC 6.2 6.4 6.4  HGB 13.0 14.2 12.7  HCT 40.4 43.9 39.4  PLT 229 235 220   Recent Labs  Lab 07/07/20 1018 07/10/20 1520 07/11/20 0056  NA 138 141 139  K 3.7 3.6 3.2*  CL 101 101 101  CO2 GLUCOSE 90 93 106*  BUN CREATININE 0.89 1.06* 0.94  CALCIUM 9.0 9.2 8.8*     Results/Tests Pending at Time of Discharge:  DG Chest 2 View  Result Date: 07/10/2020 CLINICAL DATA:  Leg swelling.  Elevated D-dimer. EXAM: CHEST - 2 VIEW COMPARISON:  07/07/2020 FINDINGS: The cardiac silhouette remains mildly enlarged. The lungs are well inflated and clear. No pleural effusion or pneumothorax is identified. No acute osseous abnormality is seen. IMPRESSION: No active cardiopulmonary disease. Electronically Signed   By: Valerie Cowan M.D.   On: 07/10/2020 14:56   CT Angio Chest PE W/Cm &/Or Wo Cm  Result Date: 07/10/2020 CLINICAL DATA:  Elevated D-dimer question pulmonary embolism, history hypertension EXAM: CT ANGIOGRAPHY CHEST WITH CONTRAST TECHNIQUE: Multidetector CT imaging of the chest was performed using the standard protocol during bolus administration of intravenous contrast. Multiplanar CT image reconstructions and MIPs were obtained to evaluate the vascular anatomy. CONTRAST:  50mL OMNIPAQUE IOHEXOL 350 MG/ML SOLN IV COMPARISON:  CT chest 09/05/2013 FINDINGS: Cardiovascular: Aorta normal caliber without aneurysm or dissection. Minimal atherosclerotic calcification aorta. Pulmonary arteries adequately opacified. Filling  defects identified in RIGHT middle and RIGHT lower lobe pulmonary arteries consistent with pulmonary emboli.  Additional small emboli in LEFT upper lobe, RIGHT upper lobe, and LEFT lower lobe. Enlargement of central pulmonary arteries. Views enlargement of cardiac chambers. No pericardial effusion. Mediastinum/Nodes: Esophagus unremarkable. Base of cervical region normal appearance. No thoracic adenopathy. Lungs/Pleura: Minimal subsegmental atelectasis LEFT lower lobe. Lungs otherwise clear. No segmental consolidation, pleural effusion or pneumothorax. Upper Abdomen: Visualized upper abdomen unremarkable Musculoskeletal: No acute osseous findings. Review of the MIP images confirms the above findings. IMPRESSION: Multiple small BILATERAL pulmonary emboli as above, greatest in RIGHT lower lobe. Diffuse enlargement of cardiac chambers. Enlargement of central pulmonary arteries question pulmonary arterial hypertension. Minimal subsegmental atelectasis LEFT lower lobe. Aortic Atherosclerosis (ICD10-I70.0). Critical Value/emergent results were called by telephone at the time of interpretation on 07/10/2020 at 1658 hours to provider Valerie Cowan, Who verbally acknowledged these results. Electronically Signed   By: Valerie Cowan M.D.   On: 07/10/2020 16:58   ECHOCARDIOGRAM COMPLETE  Result Date: 07/11/2020    ECHOCARDIOGRAM REPORT   Patient Name:   Valerie Cowan Date of Exam: 07/11/2020 Medical Rec #:  [id number]            Height:       65.0 in Accession #:    [id number]           Weight:       257.0 lb Date of Birth:  11-17-1950             BSA:          2.201 m Patient Age:    69 years             BP:           114/53 mmHg Patient Gender: F                    HR:           52 bpm. Exam Location:  Inpatient Procedure: 2D Echo, Cardiac Doppler and Color Doppler Indications:    Pulmonary embolus  History:        Patient has no prior history of Echocardiogram examinations.                 Risk Factors:Hypertension.  Sonographer:    Valerie Cowan Referring Phys: [id number] Valerie Cowan Valerie Cowan IMPRESSIONS  1. Left ventricular ejection  fraction, by estimation, is 60 to 65%. The left ventricle has normal function. The left ventricle has no regional wall motion abnormalities. There is moderate left ventricular hypertrophy. Left ventricular diastolic parameters are consistent with Grade I diastolic dysfunction (impaired relaxation).  2. Right ventricular systolic function is normal. The right ventricular size is normal.  3. Left atrial size was mildly dilated.  4. The mitral valve is normal in structure. No evidence of mitral valve regurgitation. No evidence of mitral stenosis.  5. The aortic valve is normal in structure. Aortic valve regurgitation is not visualized. No aortic stenosis is present.  6. The inferior vena cava is normal in size with greater than 50% respiratory variability, suggesting right atrial pressure of 3 mmHg. FINDINGS  Left Ventricle: Left ventricular ejection fraction, by estimation, is 60 to 65%. The left ventricle has normal function. The left ventricle has no regional wall motion abnormalities. The left ventricular internal cavity size was normal in size. There is  moderate left ventricular hypertrophy. Left ventricular diastolic parameters are consistent with Grade I diastolic dysfunction (impaired relaxation). Right  Ventricle: The right ventricular size is normal. No increase in right ventricular wall thickness. Right ventricular systolic function is normal. Left Atrium: Left atrial size was mildly dilated. Right Atrium: Right atrial size was normal in size. Pericardium: There is no evidence of pericardial effusion. Mitral Valve: The mitral valve is normal in structure. No evidence of mitral valve regurgitation. No evidence of mitral valve stenosis. MV peak gradient, 9.7 mmHg. The mean mitral valve gradient is 3.0 mmHg. Tricuspid Valve: The tricuspid valve is normal in structure. Tricuspid valve regurgitation is not demonstrated. No evidence of tricuspid stenosis. Aortic Valve: The aortic valve is normal in structure.  Aortic valve regurgitation is not visualized. No aortic stenosis is present. Aortic valve mean gradient measures 5.0 mmHg. Aortic valve peak gradient measures 8.8 mmHg. Aortic valve area, by VTI measures 2.26 cm. Pulmonic Valve: The pulmonic valve was normal in structure. Pulmonic valve regurgitation is not visualized. No evidence of pulmonic stenosis. Aorta: The aortic root is normal in size and structure. Venous: The inferior vena cava is normal in size with greater than 50% respiratory variability, suggesting right atrial pressure of 3 mmHg. IAS/Shunts: No atrial level shunt detected by color flow Doppler.  LEFT VENTRICLE PLAX 2D LVIDd:         5.40 cm  Diastology LVIDs:         3.50 cm  LV e' medial:    5.00 cm/s LV PW:         1.70 cm  LV E/e' medial:  28.0 LV IVS:        1.70 cm  LV e' lateral:   5.33 cm/s LVOT diam:     1.90 cm  LV E/e' lateral: 26.3 LV SV:         74 LV SV Index:   34 LVOT Area:     2.84 cm  RIGHT VENTRICLE             IVC RV S prime:     18.30 cm/s  IVC diam: 0.80 cm TAPSE (M-mode): 3.2 cm LEFT ATRIUM             Index       RIGHT ATRIUM           Index LA diam:        4.70 cm 2.14 cm/m  RA Area:     23.30 cm LA Vol (A2C):   88.3 ml 40.13 ml/m RA Volume:   67.40 ml  30.63 ml/m LA Vol (A4C):   86.5 ml 39.31 ml/m LA Biplane Vol: 93.3 ml 42.40 ml/m  AORTIC VALVE AV Area (Vmax):    2.34 cm AV Area (Vmean):   2.39 cm AV Area (VTI):     2.26 cm AV Vmax:           148.00 cm/s AV Vmean:          103.000 cm/s AV VTI:            0.328 m AV Peak Grad:      8.8 mmHg AV Mean Grad:      5.0 mmHg LVOT Vmax:         122.00 cm/s LVOT Vmean:        86.700 cm/s LVOT VTI:          0.262 m LVOT/AV VTI ratio: 0.80  AORTA Ao Root diam: 2.90 cm Ao Asc diam:  3.70 cm MITRAL VALVE MV Area (PHT): 3.31 cm     SHUNTS MV Area VTI:  1.19 cm     Systemic VTI:  0.26 m MV Peak grad:  9.7 mmHg     Systemic Diam: 1.90 cm MV Mean grad:  3.0 mmHg MV Vmax:       1.56 m/s MV Vmean:      77.6 cm/s MV Decel Time: 229  msec MV E velocity: 140.00 cm/s MV A velocity: 163.00 cm/s MV E/A ratio:  0.86 Donato Schultz MD Electronically signed by Donato Schultz MD Signature Date/Time: 07/11/2020/12:49:27 PM    Final    VAS [place] LOWER EXTREMITY VENOUS (DVT) (ONLY [facility] & [facility])  Result Date: 07/10/2020  Lower Venous DVT Study Patient Name:  CHRISTIA DOMKE  Date of Exam:   07/10/2020 Medical Rec #: [id number]             Accession #:    [id number] Date of Birth: 07/16/1950              Patient Gender: F Patient Age:   63Y Exam Location:  [facility] Procedure:      VAS [place] LOWER EXTREMITY VENOUS (DVT) Referring Phys: [id number] DANIELLE RAY --------------------------------------------------------------------------------  Indications: Swelling, and SOB.  Limitations: Poor ultrasound/tissue interface. Comparison Study: No prior studies. Performing Technologist: Jean Rosenthal RDMS,RVT  Examination Guidelines: A complete evaluation includes B-mode imaging, spectral Doppler, color Doppler, and power Doppler as needed of all accessible portions of each vessel. Bilateral testing is considered an integral part of a complete examination. Limited examinations for reoccurring indications may be performed as noted. The reflux portion of the exam is performed with the patient in reverse Trendelenburg.  +---------+---------------+---------+-----------+----------+-------------------+ RIGHT    CompressibilityPhasicitySpontaneityPropertiesThrombus Aging      +---------+---------------+---------+-----------+----------+-------------------+ CFV      Full           Yes      Yes                                      +---------+---------------+---------+-----------+----------+-------------------+ SFJ      Full                                                             +---------+---------------+---------+-----------+----------+-------------------+ FV Prox  Full                                                              +---------+---------------+---------+-----------+----------+-------------------+ FV Mid   Full                                                             +---------+---------------+---------+-----------+----------+-------------------+ FV DistalFull                                                             +---------+---------------+---------+-----------+----------+-------------------+  PFV      Full                                                             +---------+---------------+---------+-----------+----------+-------------------+ POP      Full           Yes      Yes                                      +---------+---------------+---------+-----------+----------+-------------------+ PTV      Full                                                             +---------+---------------+---------+-----------+----------+-------------------+ PERO                                                  Not well visualized +---------+---------------+---------+-----------+----------+-------------------+ Gastroc  Full                                                             +---------+---------------+---------+-----------+----------+-------------------+   +---------+---------------+---------+-----------+----------+-------------------+ LEFT     CompressibilityPhasicitySpontaneityPropertiesThrombus Aging      +---------+---------------+---------+-----------+----------+-------------------+ CFV      Full           Yes      Yes                                      +---------+---------------+---------+-----------+----------+-------------------+ SFJ      Full                                                             +---------+---------------+---------+-----------+----------+-------------------+ FV Prox  Full                                                             +---------+---------------+---------+-----------+----------+-------------------+ FV  Mid   Full                                                             +---------+---------------+---------+-----------+----------+-------------------+ FV DistalFull                                                             +---------+---------------+---------+-----------+----------+-------------------+  PFV      Full                                                             +---------+---------------+---------+-----------+----------+-------------------+ POP      Full           Yes      Yes                                      +---------+---------------+---------+-----------+----------+-------------------+ PTV      Full                                                             +---------+---------------+---------+-----------+----------+-------------------+ PERO                                                  Not well visualized +---------+---------------+---------+-----------+----------+-------------------+ Gastroc  Full                                                             +---------+---------------+---------+-----------+----------+-------------------+     Summary: RIGHT: - There is no evidence of deep vein thrombosis in the lower extremity. However, portions of this examination were limited- see technologist comments above.  - No cystic structure found in the popliteal fossa.  LEFT: - There is no evidence of deep vein thrombosis in the lower extremity. However, portions of this examination were limited- see technologist comments above.  - No cystic structure found in the popliteal fossa.  *See table(s) above for measurements and observations. Electronically signed by Sherald Hess MD on 07/10/2020 at 5:16:48 PM.    Final     Discharge Medications:  Allergies as of 07/11/2020   No Known Allergies     Medication List    STOP taking these medications   amLODipine 10 MG tablet Commonly known as: NORVASC   furosemide 20 MG tablet Commonly  known as: LASIX   lisinopril 40 MG tablet Commonly known as: ZESTRIL     TAKE these medications   apixaban 5 MG Tabs tablet Commonly known as: ELIQUIS Take 2 tablets (10 mg total) by mouth 2 (two) times daily for 7 days.   apixaban 5 MG Tabs tablet Commonly known as: ELIQUIS Take 1 tablet (5 mg total) by mouth 2 (two) times daily. Start taking on: Jul 18, 2020   rosuvastatin 5 MG tablet Commonly known as: CRESTOR Take 5 mg by mouth daily.   Vitamin D (Ergocalciferol) 1.25 MG (50000 UNIT) Caps capsule Commonly known as: DRISDOL Take 50,000 Units by mouth every Thursday.       Discharge Instructions: Please refer to Patient Instructions section of EMR for full details.  Patient was counseled important signs  and symptoms that should prompt return to medical care, changes in medications, dietary instructions, activity restrictions, and follow up appointments.   Follow-Up Appointments:  Follow-up Information    Valerie Livings, MD. Schedule an appointment as soon as possible for a visit in 1 week(s).   Specialty: Internal Medicine Contact information: [address] [place] [address] [phone]               Arnoldo Lenis, MD 07/11/2020, 1:09 PM PGY-1, [facility]

## 2020-07-11 NOTE — Progress Notes (Signed)
Discharge instructions (including medications) discussed with and copy provided to patient/caregiver 

## 2020-07-11 NOTE — Plan of Care (Signed)
  Problem: Education: Goal: Knowledge of General Education information will improve Description: Including pain rating scale, medication(s)/side effects and non-pharmacologic comfort measures Outcome: Adequate for Discharge   

## 2020-07-11 NOTE — Progress Notes (Signed)
ANTICOAGULATION CONSULT NOTE   Pharmacy Consult for heparin >> Eliquis  Indication: pulmonary embolus  No Known Allergies  Patient Measurements:   Heparin Dosing Weight: 120 kg (patient reported)  Vital Signs: Temp: 98.3 F (36.8 C) (05/24 0838) Temp Source: Oral (05/24 0838) BP: 113/74 (05/24 0838) Pulse Rate: 63 (05/24 0838)  Labs: Recent Labs    07/10/20 1520 07/11/20 0056 07/11/20 0759  HGB 14.2 12.7  --   HCT 43.9 39.4  --   PLT 235 220  --   HEPARINUNFRC  --  0.63 0.69  CREATININE 1.06* 0.94  --     CrCl cannot be calculated (Unknown ideal weight.).   Assessment: 26 YOF with acute bilateral pulmonary emboli to start IV heparin. H/H and Plt wnl. SCr 1.06.   Heparin level therapeutic (0.69) on gtt at 1900 units/hr. No bleeding or issues with line infusion noted. CBC wnl. Pharmacy now asked to transition patient to Eliquis.   Goal of Therapy:  Heparin level 0.3-0.7 units/ml Monitor platelets by anticoagulation protocol: Yes   Plan:  Stop heparin infusion Start Eliquis 10 mg BID x7 days, then 5 mg BID  - Monitor for s/sx of bleeding  Trixie Rude, PharmD PGY1 Acute Care Pharmacy Resident 07/11/2020 11:50 AM  Please check [email or web address] for unit-specific pharmacy phone numbers.

## 2020-07-11 NOTE — Progress Notes (Signed)
ANTICOAGULATION CONSULT NOTE   Pharmacy Consult for heparin Indication: pulmonary embolus  No Known Allergies  Patient Measurements:   Heparin Dosing Weight: 120 kg (patient reported)  Vital Signs: Temp: 98.5 F (36.9 C) (05/23 2147) Temp Source: Oral (05/23 2147) BP: 101/55 (05/23 2147) Pulse Rate: 53 (05/23 2147)  Labs: Recent Labs    07/10/20 1520 07/11/20 0056  HGB 14.2 12.7  HCT 43.9 39.4  PLT 235 220  HEPARINUNFRC  --  0.63  CREATININE 1.06*  --     CrCl cannot be calculated (Unknown ideal weight.).   Assessment: 86 YOF with acute bilateral pulmonary emboli to start IV heparin. H/H and Plt wnl. SCr 1.06.   Heparin level therapeutic (0.63) on gtt at 1900 units/hr. No bleeding noted.  Goal of Therapy:  Heparin level 0.3-0.7 units/ml Monitor platelets by anticoagulation protocol: Yes   Plan:  Continue heparin infusion at 1900 units/hr  F/u 6 hr heparin level to confirm therapeutic  Christoper Fabian, PharmD, BCPS Please see amion for complete clinical pharmacist phone list 07/11/2020 1:56 AM

## 2020-07-11 NOTE — Progress Notes (Incomplete)
  Echocardiogram 2D Echocardiogram has been performed.  Valerie Cowan 07/11/2020, 11:18 AM

## 2020-07-13 ENCOUNTER — Other Ambulatory Visit (HOSPITAL_COMMUNITY): Payer: Self-pay

## 2020-07-19 ENCOUNTER — Telehealth (HOSPITAL_COMMUNITY): Payer: Self-pay

## 2020-07-19 NOTE — Telephone Encounter (Signed)
Transitions of Care Pharmacy   Call attempted for a pharmacy transitions of care follow-up. Unable to leave VM due to full mailbox. Call attempt #3. Will no longer attempt follow up calls for [facility].

## 2020-08-08 ENCOUNTER — Other Ambulatory Visit (HOSPITAL_COMMUNITY): Payer: Self-pay

## 2020-08-18 ENCOUNTER — Other Ambulatory Visit: Payer: Self-pay | Admitting: Internal Medicine

## 2020-08-18 DIAGNOSIS — Z1231 Encounter for screening mammogram for malignant neoplasm of breast: Secondary | ICD-10-CM

## 2021-10-19 ENCOUNTER — Emergency Department: Payer: Medicare HMO

## 2021-10-19 ENCOUNTER — Other Ambulatory Visit: Payer: Self-pay

## 2021-10-19 ENCOUNTER — Emergency Department
Admission: EM | Admit: 2021-10-19 | Discharge: 2021-10-19 | Disposition: A | Discharge status: Home / Self Care | Payer: Medicare HMO | Attending: Emergency Medicine | Admitting: Emergency Medicine

## 2021-10-19 DIAGNOSIS — W1839XA Other fall on same level, initial encounter: Secondary | ICD-10-CM | POA: Insufficient documentation

## 2021-10-19 DIAGNOSIS — M79642 Pain in left hand: Secondary | ICD-10-CM | POA: Insufficient documentation

## 2021-10-19 DIAGNOSIS — S62647A Nondisplaced fracture of proximal phalanx of left little finger, initial encounter for closed fracture: Secondary | ICD-10-CM

## 2021-10-19 DIAGNOSIS — I10 Essential (primary) hypertension: Secondary | ICD-10-CM | POA: Diagnosis not present

## 2021-10-19 DIAGNOSIS — W19XXXA Unspecified fall, initial encounter: Secondary | ICD-10-CM

## 2021-10-19 DIAGNOSIS — S62617A Displaced fracture of proximal phalanx of left little finger, initial encounter for closed fracture: Secondary | ICD-10-CM | POA: Insufficient documentation

## 2021-10-19 DIAGNOSIS — S6992XA Unspecified injury of left wrist, hand and finger(s), initial encounter: Secondary | ICD-10-CM | POA: Diagnosis present

## 2021-10-19 DIAGNOSIS — R519 Headache, unspecified: Secondary | ICD-10-CM | POA: Insufficient documentation

## 2021-10-19 DIAGNOSIS — S0990XA Unspecified injury of head, initial encounter: Secondary | ICD-10-CM

## 2021-10-19 LAB — CBC WITH DIFFERENTIAL/PLATELET
Abs Immature Granulocytes: 0 10*3/uL (ref 0.00–0.07)
Basophils Absolute: 0 10*3/uL (ref 0.0–0.1)
Basophils Relative: 0 %
Eosinophils Absolute: 0.1 10*3/uL (ref 0.0–0.5)
Eosinophils Relative: 2 %
HCT: 39.1 % (ref 36.0–46.0)
Hemoglobin: 12.6 g/dL (ref 12.0–15.0)
Immature Granulocytes: 0 %
Lymphocytes Relative: 41 %
Lymphs Abs: 2.3 10*3/uL (ref 0.7–4.0)
MCH: 29.1 pg (ref 26.0–34.0)
MCHC: 32.2 g/dL (ref 30.0–36.0)
MCV: 90.3 fL (ref 80.0–100.0)
Monocytes Absolute: 0.5 10*3/uL (ref 0.1–1.0)
Monocytes Relative: 9 %
Neutro Abs: 2.7 10*3/uL (ref 1.7–7.7)
Neutrophils Relative %: 48 %
Platelets: 180 10*3/uL (ref 150–400)
RBC: 4.33 MIL/uL (ref 3.87–5.11)
RDW: 14.8 % (ref 11.5–15.5)
WBC: 5.5 10*3/uL (ref 4.0–10.5)
nRBC: 0 % (ref 0.0–0.2)

## 2021-10-19 LAB — PROTIME-INR
INR: 1 (ref 0.8–1.2)
Prothrombin Time: 13.4 seconds (ref 11.4–15.2)

## 2021-10-19 LAB — COMPREHENSIVE METABOLIC PANEL
ALT: 15 U/L (ref 0–44)
AST: 20 U/L (ref 15–41)
Albumin: 3.7 g/dL (ref 3.5–5.0)
Alkaline Phosphatase: 53 U/L (ref 38–126)
Anion gap: 6 (ref 5–15)
BUN: 9 mg/dL (ref 8–23)
CO2: 26 mmol/L (ref 22–32)
Calcium: 9 mg/dL (ref 8.9–10.3)
Chloride: 109 mmol/L (ref 98–111)
Creatinine, Ser: 0.81 mg/dL (ref 0.44–1.00)
GFR, Estimated: >60 mL/min (ref >60)
Glucose, Bld: 90 mg/dL (ref 70–99)
Potassium: 3.8 mmol/L (ref 3.5–5.1)
Sodium: 141 mmol/L (ref 135–145)
Total Bilirubin: 0.9 mg/dL (ref 0.3–1.2)
Total Protein: 7.6 g/dL (ref 6.5–8.1)

## 2021-10-19 LAB — APTT: aPTT: 29 seconds (ref 24–36)

## 2021-10-19 MED ORDER — BACITRACIN ZINC 500 UNIT/GM EX OINT: TOPICAL_OINTMENT | CUTANEOUS | 0 refills | Status: DC

## 2021-10-19 MED ORDER — OXYCODONE-ACETAMINOPHEN 5-325 MG PO TABS
1.0000 | ORAL_TABLET | Freq: Once | ORAL | Status: AC
  Administered 2021-10-19: 1 via ORAL
  Filled 2021-10-19: qty 1

## 2021-10-19 MED ORDER — OXYCODONE-ACETAMINOPHEN 5-325 MG PO TABS: 1.0000 | ORAL_TABLET | ORAL | 0 refills | Status: AC | PRN

## 2021-10-19 MED ORDER — OXYCODONE-ACETAMINOPHEN 5-325 MG PO TABS: 1.0000 | ORAL_TABLET | ORAL | 0 refills | Status: DC | PRN

## 2021-10-19 MED ORDER — BACITRACIN ZINC 500 UNIT/GM EX OINT: TOPICAL_OINTMENT | CUTANEOUS | 0 refills | Status: AC

## 2021-10-19 NOTE — ED Provider Triage Note (Signed)
Emergency Medicine Provider Triage Evaluation Note  XITLALLY MOONEYHAM, a 71 y.o. female  was evaluated in triage.  Pt complains of medical fall resulting in facial trauma.  She presents to the ED from work via EMS, with reports of mechanical fall landing forward on her face and creating pain to her left hand.  She denies any LOC but does endorse blood thinner use.  Patient denies any other injury at this time.  Review of Systems  Positive: Facial trauma, left hand pain Negative: FCS  Physical Exam  BP (!) 170/76   Pulse 72   Temp 98.7 F (37.1 C) (Oral)   Resp 17   Ht 5\' 5"  (1.651 m)   Wt 117.5 kg   SpO2 100%   BMI 43.10 kg/m  Gen:   Awake, no distress  NAD Resp:  Normal effort CTA MSK:   Moves extremities without difficulty Left hand without deformity CVS:  RRR  Medical Decision Making  Medically screening exam initiated at 2:19 PM.  Appropriate orders placed.  RAKIYAH ESCH was informed that the remainder of the evaluation will be completed by another provider, this initial triage assessment does not replace that evaluation, and the importance of remaining in the ED until their evaluation is complete.  Patient to the ED for evaluation of injury sustained following mechanical fall.  Patient presents with some abrasions and hematoma to the central face as well as some left hand pain.  She denies any LOC but does endorse blood thinner use.   [name], PA-C 10/19/21 1421

## 2021-10-19 NOTE — ED Notes (Signed)
Finger splint applied.

## 2021-10-19 NOTE — ED Triage Notes (Signed)
Arrived by EMS for fall. Hematoma present to forehead. Takes blood thinners daily. A&O x4 per EMS.   Hx HTN  EMS vitals: 170/86 b/p 84HR 94% RA  C/o nose, left wrist, and left lower back pain

## 2021-10-19 NOTE — Discharge Instructions (Signed)
As we discussed please use bacitracin to your wounds on your face twice daily for the next 7 days.  Please use your pain medication as needed but only as prescribed.  Please call the number provided for orthopedics on Tuesday to arrange a follow-up appointment for recheck/reevaluation.  Please use your aluminum splint for finger stability.

## 2021-10-19 NOTE — ED Triage Notes (Signed)
Pt arrived via EMS with hematoma to center of forehead and skin tear on nose. Pt very sleepy in triage unable to keep eyes opened. Pt states she missed a step and fell while at a pt house. Pt denies LOC but is on a blood thinner.

## 2021-10-19 NOTE — ED Provider Notes (Signed)
[facility] Provider Note    Event Date/Time   First MD Initiated Contact with Patient 10/19/21 1725     (approximate)  History   Chief Complaint: Fall  HPI  Valerie Cowan is a 71 y.o. female with a past medical history of anxiety, depression, hypertension, presents to the emergency department after a mechanical fall.  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  Patient is having pain in her face as well as her left hand.  Denies LOC denies vomiting.  Does state anticoagulation.  Record review shows Eliquis.  Physical Exam   Triage Vital Signs: ED Triage Vitals  Enc Vitals Group     BP 10/19/21 1410 (!) 170/76     Pulse Rate 10/19/21 1410 72     Resp 10/19/21 1410 17     Temp 10/19/21 1410 98.7 F (37.1 C)     Temp Source 10/19/21 1410 Oral     SpO2 10/19/21 1410 100 %     Weight 10/19/21 1411 259 lb (117.5 kg)     Height 10/19/21 1411 5\' 5"  (1.651 m)     Head Circumference --      Peak Flow --      Pain Score 10/19/21 1411 9     Pain Loc --      Pain Edu? --      Excl. in GC? --     Most recent vital signs: Vitals:   10/19/21 1410  BP: (!) 170/76  Pulse: 72  Resp: 17  Temp: 98.7 F (37.1 C)  SpO2: 100%    General: Awake, no distress.  Small hematoma to the central forehead 2 small abrasions to the forehead 1 small abrasion to the upper lip all of which are hemostatic.  No lacerations. CV:  Good peripheral perfusion.  Regular rate and rhythm  Resp:  Normal effort.  Equal breath sounds bilaterally.  Abd:  No distention.  Soft, nontender.  No rebound or guarding. Other:  Moderate tenderness to palpation of the left fifth finger   ED Results / Procedures / Treatments    RADIOLOGY  I have reviewed and interpreted the CT head images.  No large bleed seen on my evaluation. Radiology is read the CT scan of  the head C-spine and face is negative for acute significant abnormality.   MEDICATIONS ORDERED IN ED: Medications - No data to display   IMPRESSION / MDM / ASSESSMENT AND PLAN / ED COURSE  I reviewed the triage vital signs and the nursing notes.  Patient's presentation is most consistent with acute presentation with potential threat to life or bodily function.  Patient presents emergency department after a fall with a head injury while on Eliquis.  Overall the patient appears well, reassuring physical exam.  Patient has been ambulatory since event without any issue per patient.  Patient does have 3 small abrasions to her face.  CT scans of the head face and neck are negative for acute abnormality.  We will discharge the patient with a short course of pain medication.  X-ray does show a left proximal fifth digit phalanx fracture.  We will buddy tape to the fourth digit and place an aluminum splint and have the patient follow-up with orthopedics in 1 week for recheck.  Patient agreeable to plan of care.  FINAL CLINICAL IMPRESSION(S) / ED DIAGNOSES  Left fifth finger fracture Fall Head injury   Note:  This document was prepared using Dragon voice recognition software and may include unintentional dictation errors.   Minna Antis, MD 10/19/21 [phone]

## 2022-10-29 ENCOUNTER — Other Ambulatory Visit: Payer: Self-pay | Admitting: Internal Medicine

## 2022-10-29 DIAGNOSIS — Z1231 Encounter for screening mammogram for malignant neoplasm of breast: Secondary | ICD-10-CM

## 2022-11-08 ENCOUNTER — Encounter: Payer: Self-pay | Admitting: Gastroenterology

## 2022-11-28 ENCOUNTER — Ambulatory Visit
Admission: RE | Admit: 2022-11-28 | Discharge: 2022-11-28 | Disposition: A | Discharge status: Home / Self Care | Payer: Medicare HMO | Source: Ambulatory Visit | Attending: Internal Medicine | Admitting: Internal Medicine

## 2022-11-28 DIAGNOSIS — Z1231 Encounter for screening mammogram for malignant neoplasm of breast: Secondary | ICD-10-CM

## 2023-02-03 ENCOUNTER — Ambulatory Visit: Payer: Medicare HMO | Admitting: Gastroenterology

## 2024-01-05 ENCOUNTER — Emergency Department
Admission: EM | Admit: 2024-01-05 | Discharge: 2024-01-05 | Disposition: A | Discharge status: Home / Self Care | Attending: Emergency Medicine | Admitting: Emergency Medicine

## 2024-01-05 ENCOUNTER — Other Ambulatory Visit: Payer: Self-pay

## 2024-01-05 DIAGNOSIS — R42 Dizziness and giddiness: Secondary | ICD-10-CM | POA: Diagnosis present

## 2024-01-05 DIAGNOSIS — I10 Essential (primary) hypertension: Secondary | ICD-10-CM | POA: Insufficient documentation

## 2024-01-05 LAB — CBC
HCT: 43.2 % (ref 36.0–46.0)
Hemoglobin: 13.9 g/dL (ref 12.0–15.0)
MCH: 29.4 pg (ref 26.0–34.0)
MCHC: 32.2 g/dL (ref 30.0–36.0)
MCV: 91.3 fL (ref 80.0–100.0)
Platelets: 221 K/uL (ref 150–400)
RBC: 4.73 MIL/uL (ref 3.87–5.11)
RDW: 14.5 % (ref 11.5–15.5)
WBC: 5.9 K/uL (ref 4.0–10.5)
nRBC: 0 % (ref 0.0–0.2)

## 2024-01-05 LAB — COMPREHENSIVE METABOLIC PANEL WITH GFR
ALT: 9 U/L (ref 0–44)
AST: 20 U/L (ref 15–41)
Albumin: 4 g/dL (ref 3.5–5.0)
Alkaline Phosphatase: 62 U/L (ref 38–126)
Anion gap: 9 (ref 5–15)
BUN: 14 mg/dL (ref 8–23)
CO2: 30 mmol/L (ref 22–32)
Calcium: 9 mg/dL (ref 8.9–10.3)
Chloride: 105 mmol/L (ref 98–111)
Creatinine, Ser: 0.91 mg/dL (ref 0.44–1.00)
GFR, Estimated: >60 mL/min (ref >60)
Glucose, Bld: 125 mg/dL — ABNORMAL HIGH (ref 70–99)
Potassium: 3.8 mmol/L (ref 3.5–5.1)
Sodium: 143 mmol/L (ref 135–145)
Total Bilirubin: 0.6 mg/dL (ref 0.0–1.2)
Total Protein: 7.4 g/dL (ref 6.5–8.1)

## 2024-01-05 LAB — CBG MONITORING, ED: Glucose-Capillary: 108 mg/dL — ABNORMAL HIGH (ref 70–99)

## 2024-01-05 MED ORDER — MECLIZINE HCL 25 MG PO TABS: 25.0000 mg | ORAL_TABLET | Freq: Three times a day (TID) | ORAL | 0 refills | Status: AC | PRN

## 2024-01-05 MED ORDER — MECLIZINE HCL 25 MG PO TABS
25.0000 mg | ORAL_TABLET | Freq: Once | ORAL | Status: AC
  Administered 2024-01-05: 25 mg via ORAL
  Filled 2024-01-05: qty 1

## 2024-01-05 NOTE — ED Triage Notes (Signed)
 Pt arrives via POV with c/o dizziness that started Saturday night. Pt states that they ate soup on Saturday that they vomited up, but did not have any emesis episodes yesterday when eating. Pt also reports bilateral leg weakness. Pt is A&Ox4 and ambulatory during triage.

## 2024-01-05 NOTE — ED Provider Notes (Signed)
 [facility] Provider Note    Event Date/Time   First MD Initiated Contact with Patient 01/05/24 0818     (approximate)   History   Dizziness   HPI  Valerie Cowan is a 73 year old female with history of hypertension presenting to the emergency department for evaluation of dizziness.  Patient reports she ate some soup on Saturday that she ultimately vomited up, denies any current abdominal pain.  Later that day she noticed onset of dizziness described as a spinning sensation.  This is worse with head movements and when she is up and walking around.  Finds it is hard to walk straight due to the moving sensation.  No syncope.  No numbness, tingling, focal weakness.  Denies history of similar.  No ongoing nausea or vomiting.     Physical Exam   Triage Vital Signs: ED Triage Vitals  Encounter Vitals Group     BP 01/05/24 0809 (!) 141/61     Girls Systolic BP Percentile --      Girls Diastolic BP Percentile --      Boys Systolic BP Percentile --      Boys Diastolic BP Percentile --      Pulse Rate 01/05/24 0809 (!) 57     Resp 01/05/24 0809 18     Temp 01/05/24 0809 99.4 F (37.4 C)     Temp Source 01/05/24 0809 Oral     SpO2 01/05/24 0809 97 %     Weight 01/05/24 0811 263 lb (119.3 kg)     Height 01/05/24 0811 5' 5 (1.651 m)     Head Circumference --      Peak Flow --      Pain Score 01/05/24 0810 0     Pain Loc --      Pain Education --      Exclude from Growth Chart --     Most recent vital signs: Vitals:   01/05/24 0900 01/05/24 0930  BP: (!) 131/59 138/66  Pulse: (!) 48   Resp: 16 15  Temp:    SpO2: 98% 100%     General: Awake, interactive  CV:  Good peripheral perfusion Resp:  Unlabored respirations, lungs clear to auscultation Abd:  Nondistended, soft, nontender to palpation Neuro:  Keenly aware, correctly answers month and age, able to blink eyes and squeeze hands, normal horizontal extraocular movements, no visual field  loss, normal facial symmetry, no arm or leg motor drift, no limb ataxia, normal sensation, no aphasia, no dysarthria, no inattention. NIH 0.  No nystagmus.  Dix-Hallpike maneuver elicits dizziness symptoms, but no nystagmus noted during this.   ED Results / Procedures / Treatments   Labs (all labs ordered are listed, but only abnormal results are displayed) Labs Reviewed  COMPREHENSIVE METABOLIC PANEL WITH GFR - Abnormal; Notable for the following components:      Result Value   Glucose, Bld 125 (*)    All other components within normal limits  CBG MONITORING, ED - Abnormal; Notable for the following components:   Glucose-Capillary 108 (*)    All other components within normal limits  CBC  URINALYSIS, ROUTINE W REFLEX MICROSCOPIC     EKG EKG independently reviewed and interpreted by myself demonstrates:  EKG demonstrate sinus bradycardia at a rate of 56, PR 188, QRS 104, QTc 411, no acute ST changes  RADIOLOGY Imaging independently reviewed and interpreted by myself demonstrates:   Formal Radiology Read:  No results found.  PROCEDURES:  Critical Care  performed: No  Procedures   MEDICATIONS ORDERED IN ED: Medications  meclizine (ANTIVERT) tablet 25 mg (25 mg Oral Given 01/05/24 0903)     IMPRESSION / MDM / ASSESSMENT AND PLAN / ED COURSE  I reviewed the triage vital signs and the nursing notes.  Differential diagnosis includes, but is not limited to, vertigo including BPPV triggered by recent vomiting episode, consideration but overall lower suspicion for central vertigo such as CVA, intracranial bleed in the absence of focal neurologic deficits and with worsening symptoms provoked by head movements, consideration for anemia, electrolyte abnormality, arrhythmia  Patient's presentation is most consistent with acute presentation with potential threat to life or bodily function.  73 year old female presenting to the emergency department for evaluation of dizziness  described as a spinning sensation.  Mild bradycardia, otherwise stable vitals on presentation.  EKG reassuring.  Labs sent from triage.  Clinical history seems most consistent with peripheral vertigo, suspect likely BPPV with her recent vomiting.  Will trial meclizine.  If significantly improved, suspect she will be stable for discharge home.  If she has refractory symptoms, consideration for neuroimaging at that time but do not feel it is currently indicated.  Clinical Course as of 01/05/24 1046  Mon Jan 05, 2024  9045 Patient reassessed.  Reports feeling much improved with only mild residual dizziness.  Will attempt to ambulate the patient, if she is able to ambulate with steady gait and tolerable symptoms, suspect she will be stable for discharge. [NR]  1043 Patient able to tolerate ambulation with significant improvement in her symptoms and only mild residual dizziness.  Discussed further imaging, but patient feels improved and is comfortable with holding off.  Understands that we did not rule out central processes with our evaluation today.  She will follow-up as an outpatient for further evaluation.  Strict return precautions provided.  Patient discharged in stable condition. [NR]    Clinical Course User Index [NR] Levander Slate, MD     FINAL CLINICAL IMPRESSION(S) / ED DIAGNOSES   Final diagnoses:  Vertigo     Rx / DC Orders   ED Discharge Orders          Ordered    meclizine (ANTIVERT) 25 MG tablet  3 times daily PRN        01/05/24 1046             Note:  This document was prepared using Dragon voice recognition software and may include unintentional dictation errors.   Levander Slate, MD 01/05/24 1046

## 2024-01-05 NOTE — Discharge Instructions (Addendum)
 You were seen in the ER today for your dizziness.  We fortunately did not and emergency cause for this.  I suspect you have something called vertigo.  I sent a prescription for a medication to help with this to your pharmacy. Follow-up with your primary care doctor or ENT for further evaluation.  Return to the ER for new or worsening symptoms including severe headache, new numbness, tingling, focal weakness, or any other new or concerning symptoms.
